# Patient Record
Sex: Female | Born: 1995 | Race: White | Hispanic: No | Marital: Single | Smoking: Never smoker
Health system: Southern US, Community
[De-identification: ages and names within clinical notes are randomized; demographics above are authoritative.]

## PROBLEM LIST (undated history)

## (undated) DIAGNOSIS — R519 Headache, unspecified: Secondary | ICD-10-CM

## (undated) DIAGNOSIS — R51 Headache: Secondary | ICD-10-CM

## (undated) DIAGNOSIS — J45909 Unspecified asthma, uncomplicated: Secondary | ICD-10-CM

---
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
## 2018-07-06 ENCOUNTER — Ambulatory Visit (INDEPENDENT_AMBULATORY_CARE_PROVIDER_SITE_OTHER): Payer: No Typology Code available for payment source | Admitting: Family Medicine

## 2018-07-06 ENCOUNTER — Encounter: Payer: Self-pay | Admitting: Family Medicine

## 2018-07-06 VITALS — BP 120/78 | HR 90 | Temp 98.4°F | Ht 61.75 in | Wt 141.0 lb

## 2018-07-06 DIAGNOSIS — G8929 Other chronic pain: Secondary | ICD-10-CM

## 2018-07-06 DIAGNOSIS — M25561 Pain in right knee: Secondary | ICD-10-CM | POA: Diagnosis not present

## 2018-07-06 DIAGNOSIS — G43109 Migraine with aura, not intractable, without status migrainosus: Secondary | ICD-10-CM | POA: Diagnosis not present

## 2018-07-06 DIAGNOSIS — F419 Anxiety disorder, unspecified: Secondary | ICD-10-CM

## 2018-07-06 DIAGNOSIS — G43909 Migraine, unspecified, not intractable, without status migrainosus: Secondary | ICD-10-CM | POA: Insufficient documentation

## 2018-07-06 DIAGNOSIS — G4700 Insomnia, unspecified: Secondary | ICD-10-CM | POA: Diagnosis not present

## 2018-07-06 DIAGNOSIS — Z23 Encounter for immunization: Secondary | ICD-10-CM

## 2018-07-06 MED ORDER — SUMATRIPTAN SUCCINATE 100 MG PO TABS: 100.0000 mg | ORAL_TABLET | Freq: Every day | ORAL | 1 refills | Status: DC | PRN

## 2018-07-06 NOTE — Progress Notes (Signed)
Rachel [name] DOB: Sep 06, 1996 Encounter date: 07/06/2018  This isa 22 y.o. female who presents to establish care. Chief Complaint  Patient presents with  . New Patient (Initial Visit)  . Knee Pain    X 2 years, more painful the more she uses it, pt staes " there is a knott on the side of my knee that sticks out sometimes"     History of present illness: [place] so adjusting to move. 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   Has a very hard time falling asleep. Has always had this. Hard to turn off mind. Wakes frequently through night. Always wakes up tired. When in 6th grade treated with topiramate for chronic migraines. Used maxalt for this in past. Wakes with headaches most days. Initially helped but then stopped responding to medication. Birth control helped with headaches quite a lot. Sometimes she gets headaches that last for a few weeks straight. Was referred to neuro but never got to go. Mom had brain tumor so she does worry about this. With migraines does throw up. Light and sound sensitivity. Gets them a few times/month. Stress related; worries about "everything all the time". Likes to lay in dark when she gets these. Has a lot of anxiety attacks. Works with her five senses and boyfriend helps her breathe through panic attacks which is helpful. Can last for a week. Feels like she wakes with headaches and then small things can set off worsening migraine. Will try advil (usually 468m); but usually not more than a couple of times. Excedrin migraine does help. Birth control helped most of every medication. (also helped with her heavy menses and cramping).  Getting new vitamin L-tyrosin, melatonin, maca to help with migraine.   There was some concern with her being ADHD in the past. She does feel like she has trouble focusing, esp when reading. Doing online schooling right now. Testing did not reveal ADHD, but she still has some symptoms. Does feel like it affects her grades  and ends up re-reading everything multiple times to understand it.   Has had eye check recently. Working with eye doc to get rx correct.   In middle school had left meniscus torn and went 3 years later to doc and found torn meniscus and had surgery. That knee has done well since surgery. Right knee injury shortly after. Doing bleacher jumps and had intense pain, told her there was torn cartilage and no surgery needed. Then would flare with use or intense activity. Then 2 years ago noted that there is bump that comes up lateral right knee that flares when she gets knee pain. Just hurts when using knee. More active she is the more it bothers her and flares. Monday walked for a couple of miles and then bump enlarged and knee bothered her more.    No past medical history on file. Past Surgical History:  Procedure Laterality Date  . KNEE ARTHROSCOPY Left 2013   meniscus repair   Allergies  Allergen Reactions  . Sulfur Hives   Current Meds  Medication Sig  . norethindrone-ethinyl estradiol 1/35 (NORTREL 1/35, 21,) tablet Take 1 tablet by mouth daily.   Social History   Tobacco Use  . Smoking status: Never Smoker  . Smokeless tobacco: Never Used  Substance Use Topics  . Alcohol use: Yes    Comment: occasional   Family History  Problem Relation Age of Onset  . Bipolar disorder Mother   . Schizophrenia Mother  significant paranoia  . OCD Mother   . Migraines Mother   . Atrial fibrillation Father   . High Cholesterol Father   . Migraines Sister   . CAD Maternal Grandmother   . Vascular Disease Maternal Grandmother   . Mental illness Paternal Grandmother   . Migraines Sister   . Migraines Sister   . High Cholesterol Paternal Grandfather   . Heart disease Paternal Grandfather   . Migraines Paternal Grandfather      Review of Systems  Respiratory: Negative for cough, chest tightness and shortness of breath.   Cardiovascular: Negative for chest pain and leg swelling.   Genitourinary: Negative for menstrual problem (does well with ocp).  Musculoskeletal: Positive for gait problem (when R knee bothering her) and joint swelling.       Will get shaking right leg, esp if anxious.  Neurological: Positive for headaches. Negative for dizziness and light-headedness.  Psychiatric/Behavioral: Positive for sleep disturbance. The patient is nervous/anxious.     Objective:  BP 120/78 (BP Location: Left Arm, Patient Position: Sitting, Cuff Size: Normal)   Pulse 90   Temp 98.4 F (36.9 C) (Oral)   Ht 5' 1.75" (1.568 m)   Wt 141 lb (64 kg)   SpO2 99%   BMI 26.00 kg/m   Weight: 141 lb (64 kg)   BP Readings from Last 3 Encounters:  07/06/18 120/78   Wt Readings from Last 3 Encounters:  07/06/18 141 lb (64 kg)    Physical Exam  Constitutional: She is oriented to person, place, and time. She appears well-developed and well-nourished. No distress.  Eyes: Pupils are equal, round, and reactive to light.  Neck: Normal range of motion. Neck supple. No thyromegaly present.  Cardiovascular: Normal rate, regular rhythm and normal heart sounds. Exam reveals no friction rub.  No murmur heard. No lower extremity edema  Pulmonary/Chest: Effort normal and breath sounds normal. No respiratory distress. She has no wheezes. She has no rales.  Abdominal: Soft. Bowel sounds are normal. She exhibits no distension and no mass. There is no tenderness. There is no guarding.  Musculoskeletal:  Full ROM of right knee. There is some pain with lateral meniscal pressure and a slight lateral JLT. There is no obvious effusion. There is some patellar hypermobility. No patellar tenderness.   Lymphadenopathy:    She has no cervical adenopathy.  Neurological: She is alert and oriented to person, place, and time.  Psychiatric: She has a normal mood and affect. Her speech is normal and behavior is normal. Thought content normal. Cognition and memory are normal.  ADHD adult questionnaire  scanned. + for significant difficulty with concentration.      Assessment/Plan: 1. Insomnia, unspecified type Good sleep hygiene. Consider sleep study in future pending lab review/physical.  2. Migraine with aura and without status migrainosus, not intractable Chronic; trial imitrex for headache abortant. Low tyramine diet information given today. Recommend headache diary for next month. Will order follow up labwork/studies pending record review. Let me know sooner if worsening or change in sx. We discussed consideration for propranolol as well since this may also help with anxiety.  Also suggested B2 474m daily to help with prophylaxis. Exercise is important as well; can consider lower impact like tai chi to help with de-stress.   - SUMAtriptan (IMITREX) 100 MG tablet; Take 1 tablet (100 mg total) by mouth daily as needed for migraine. May repeat in 2 hours if headache persists or recurs not to exceed 2 tablets in 24 hours.  Dispense: 10 tablet; Refill: 1  3. Chronic pain of right knee Due to longstanding and recurring issue; will send to ortho for further eval and treatment plan. - Ambulatory referral to Orthopedic Surgery  4. Need for hepatitis B booster vaccination Completed in office today. - Hepatitis B vaccine adult IM  Return pending lab review.  Micheline Rough, MD

## 2018-07-06 NOTE — Patient Instructions (Addendum)
Consider Riboflavin B2 for migraine prophylaxis 400mg  daily.   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 I will likely order additional bloodwork and then schedule a follow up to re-discuss migraines/sleep issues.     Insomnia Insomnia is a sleep disorder that makes it difficult to fall asleep or to stay asleep. Insomnia can cause tiredness (fatigue), low energy, difficulty concentrating, mood swings, and poor performance at work or school. There are three different ways to classify insomnia:  Difficulty falling asleep.  Difficulty staying asleep.  Waking up too early in the morning.  Any type of insomnia can be long-term (chronic) or short-term (acute). Both are common. Short-term insomnia usually lasts for three months or less. Chronic insomnia occurs at least three times a week for longer than three months. What are the causes? Insomnia may be caused by another condition, situation, or substance, such as:  Anxiety.  Certain medicines.  Gastroesophageal reflux disease (GERD) or other gastrointestinal conditions.  Asthma or other breathing conditions.  Restless legs syndrome, sleep apnea, or other sleep disorders.  Chronic pain.  Menopause. This may include hot flashes.  Stroke.  Abuse of alcohol, tobacco, or illegal drugs.  Depression.  Caffeine.  Neurological disorders, such as Alzheimer disease.  An overactive thyroid (hyperthyroidism).  The cause of insomnia may not be known. What increases the risk? Risk factors for insomnia include:  Gender. Women are more commonly affected than men.  Age. Insomnia is more common as you get older.  Stress. This may involve your professional or personal life.  Income. Insomnia is more common in people with lower income.  Lack of exercise.  Irregular work schedule or night shifts.  Traveling between different time zones.  What are the signs or symptoms? If you have insomnia, trouble falling asleep  or trouble staying asleep is the main symptom. This may lead to other symptoms, such as:  Feeling fatigued.  Feeling nervous about going to sleep.  Not feeling rested in the morning.  Having trouble concentrating.  Feeling irritable, anxious, or depressed.  How is this treated? Treatment for insomnia depends on the cause. If your insomnia is caused by an underlying condition, treatment will focus on addressing the condition. Treatment may also include:  Medicines to help you sleep.  Counseling or therapy.  Lifestyle adjustments.  Follow these instructions at home:  Take medicines only as directed by your health care provider.  Keep regular sleeping and waking hours. Avoid naps.  Keep a sleep diary to help you and your health care provider figure out what could be causing your insomnia. Include: ? When you sleep. ? When you wake up during the night. ? How well you sleep. ? How rested you feel the next day. ? Any side effects of medicines you are taking. ? What you eat and drink.  Make your bedroom a comfortable place where it is easy to fall asleep: ? Put up shades or special blackout curtains to block light from outside. ? Use a white noise machine to block noise. ? Keep the temperature cool.  Exercise regularly as directed by your health care provider. Avoid exercising right before bedtime.  Use relaxation techniques to manage stress. Ask your health care provider to suggest some techniques that may work well for you. These may include: ? Breathing exercises. ? Routines to release muscle tension. ? Visualizing peaceful scenes.  Cut back on alcohol, caffeinated beverages, and cigarettes, especially close to bedtime. These can disrupt your sleep.  Do not  overeat or eat spicy foods right before bedtime. This can lead to digestive discomfort that can make it hard for you to sleep.  Limit screen use before bedtime. This includes: ? Watching TV. ? Using your  smartphone, tablet, and computer.  Stick to a routine. This can help you fall asleep faster. Try to do a quiet activity, brush your teeth, and go to bed at the same time each night.  Get out of bed if you are still awake after 15 minutes of trying to sleep. Keep the lights down, but try reading or doing a quiet activity. When you feel sleepy, go back to bed.  Make sure that you drive carefully. Avoid driving if you feel very sleepy.  Keep all follow-up appointments as directed by your health care provider. This is important. Contact a health care provider if:  You are tired throughout the day or have trouble in your daily routine due to sleepiness.  You continue to have sleep problems or your sleep problems get worse. Get help right away if:  You have serious thoughts about hurting yourself or someone else. This information is not intended to replace advice given to you by your health care provider. Make sure you discuss any questions you have with your health care provider. Document Released: 10/09/2000 Document Revised: 03/13/2016 Document Reviewed: 07/13/2014 Elsevier Interactive Patient Education  [employer].

## 2018-07-08 ENCOUNTER — Encounter: Payer: Self-pay | Admitting: Family Medicine

## 2018-07-11 ENCOUNTER — Telehealth: Payer: Self-pay

## 2018-07-11 NOTE — Telephone Encounter (Signed)
Noted will send to Dr. Hassan RowanKoberlein as Lorain ChildesFYI

## 2018-07-11 NOTE — Telephone Encounter (Signed)
Pt states that she has not had those labs done.

## 2018-07-11 NOTE — Telephone Encounter (Signed)
Called patient per request of Dr. Hassan RowanKoberlein to ask if labs such as blood glucose, lipid panel ect. Have been done. Patient has been asked to call back.

## 2018-07-13 ENCOUNTER — Other Ambulatory Visit: Payer: Self-pay | Admitting: Family Medicine

## 2018-07-13 DIAGNOSIS — Z13220 Encounter for screening for lipoid disorders: Secondary | ICD-10-CM

## 2018-07-13 DIAGNOSIS — G43809 Other migraine, not intractable, without status migrainosus: Secondary | ICD-10-CM

## 2018-07-13 DIAGNOSIS — Z131 Encounter for screening for diabetes mellitus: Secondary | ICD-10-CM

## 2018-07-13 MED FILL — SUMAtriptan SUCCINATE 100 M: 100 | 30 days supply | Qty: 9 | Fill #0

## 2018-07-13 NOTE — Telephone Encounter (Signed)
Called and spoke with patient she stated that she would call back to make a lab appointment once she knows her work schedule. Patient also agreed to follow up a few months after lab results come back.   Patient asked about referral that was placed 07/06/18, Patient was informed that their office had contacted her about scheduling her appointment but they said the number was disconnected and they confirmed her number with us. Patient has been given contact info for their office and advised to call back If she is unable to get an appointment since the referral has been closed.

## 2018-07-13 NOTE — Telephone Encounter (Signed)
I have ordered bloodwork for her to complete (fasting) when convenient. We can then plan to review at a follow up appointment where we also re-visit her migraines. I would recommend pushing this appointment out at least a couple of months so we can get a good symptoms diary to review.

## 2018-07-14 ENCOUNTER — Other Ambulatory Visit (INDEPENDENT_AMBULATORY_CARE_PROVIDER_SITE_OTHER): Payer: No Typology Code available for payment source

## 2018-07-14 DIAGNOSIS — Z13220 Encounter for screening for lipoid disorders: Secondary | ICD-10-CM

## 2018-07-14 DIAGNOSIS — G43809 Other migraine, not intractable, without status migrainosus: Secondary | ICD-10-CM | POA: Diagnosis not present

## 2018-07-14 DIAGNOSIS — Z131 Encounter for screening for diabetes mellitus: Secondary | ICD-10-CM | POA: Diagnosis not present

## 2018-07-14 LAB — CBC WITH DIFFERENTIAL/PLATELET
BASOS PCT: 1.7 % (ref 0.0–3.0)
Basophils Absolute: 0.1 10*3/uL (ref 0.0–0.1)
EOS ABS: 0.1 10*3/uL (ref 0.0–0.7)
Eosinophils Relative: 2.8 % (ref 0.0–5.0)
HCT: 39.9 % (ref 36.0–46.0)
HEMOGLOBIN: 13.5 g/dL (ref 12.0–15.0)
Lymphocytes Relative: 49.6 % — ABNORMAL HIGH (ref 12.0–46.0)
Lymphs Abs: 1.9 10*3/uL (ref 0.7–4.0)
MCHC: 33.8 g/dL (ref 30.0–36.0)
MCV: 87.1 fl (ref 78.0–100.0)
MONO ABS: 0.3 10*3/uL (ref 0.1–1.0)
Monocytes Relative: 6.6 % (ref 3.0–12.0)
Neutro Abs: 1.5 10*3/uL (ref 1.4–7.7)
Neutrophils Relative %: 39.3 % — ABNORMAL LOW (ref 43.0–77.0)
Platelets: 186 10*3/uL (ref 150.0–400.0)
RBC: 4.58 Mil/uL (ref 3.87–5.11)
RDW: 12.7 % (ref 11.5–15.5)
WBC: 3.8 10*3/uL — AB (ref 4.0–10.5)

## 2018-07-14 LAB — LIPID PANEL
CHOLESTEROL: 127 mg/dL (ref 0–200)
HDL: 89.6 mg/dL (ref >39.00)
LDL CALC: 31 mg/dL (ref 0–99)
NONHDL: 37.63
TRIGLYCERIDES: 32 mg/dL (ref 0.0–149.0)
Total CHOL/HDL Ratio: 1
VLDL: 6.4 mg/dL (ref 0.0–40.0)

## 2018-07-14 LAB — COMPREHENSIVE METABOLIC PANEL
ALT: 13 U/L (ref 0–35)
AST: 14 U/L (ref 0–37)
Albumin: 4.1 g/dL (ref 3.5–5.2)
Alkaline Phosphatase: 33 U/L — ABNORMAL LOW (ref 39–117)
BUN: 13 mg/dL (ref 6–23)
CALCIUM: 9.3 mg/dL (ref 8.4–10.5)
CO2: 29 meq/L (ref 19–32)
CREATININE: 0.63 mg/dL (ref 0.40–1.20)
Chloride: 104 mEq/L (ref 96–112)
GFR: 125.74 mL/min (ref >60.00)
Glucose, Bld: 78 mg/dL (ref 70–99)
Potassium: 4.2 mEq/L (ref 3.5–5.1)
SODIUM: 140 meq/L (ref 135–145)
Total Bilirubin: 0.5 mg/dL (ref 0.2–1.2)
Total Protein: 6.6 g/dL (ref 6.0–8.3)

## 2018-07-14 LAB — TSH: TSH: 1.51 u[IU]/mL (ref 0.35–4.50)

## 2018-08-03 ENCOUNTER — Ambulatory Visit (INDEPENDENT_AMBULATORY_CARE_PROVIDER_SITE_OTHER): Payer: No Typology Code available for payment source | Admitting: Orthopaedic Surgery

## 2018-08-03 ENCOUNTER — Encounter (INDEPENDENT_AMBULATORY_CARE_PROVIDER_SITE_OTHER): Payer: Self-pay | Admitting: Orthopaedic Surgery

## 2018-08-03 ENCOUNTER — Ambulatory Visit (INDEPENDENT_AMBULATORY_CARE_PROVIDER_SITE_OTHER): Payer: No Typology Code available for payment source

## 2018-08-03 VITALS — BP 117/68 | HR 63 | Ht 62.5 in | Wt 139.2 lb

## 2018-08-03 DIAGNOSIS — G8929 Other chronic pain: Secondary | ICD-10-CM | POA: Diagnosis not present

## 2018-08-03 DIAGNOSIS — M25561 Pain in right knee: Secondary | ICD-10-CM

## 2018-08-03 NOTE — Progress Notes (Signed)
   Office Visit Note   Patient: Rachel [name]           Date of Birth: 1996-10-25           MRN: [id number] Visit Date: 08/03/2018              Requested by: Wynn Banker, MD [address] [place], [place] [address] PCP: Wynn Banker, MD   Assessment & Plan: Visit Diagnoses:  1. Chronic pain of right knee     Plan: might have tear of MM-will order MRI  Follow-Up Instructions: Return after MRI right knee.   Orders:  Orders Placed This Encounter  Procedures  . XR KNEE 3 VIEW RIGHT   No orders of the defined types were placed in this encounter.     Procedures: No procedures performed   Clinical Data: No additional findings.   Subjective: No chief complaint on file. 22 yr female with at least 7 yr hx right knee pain with presumptive dx of "torn Meniscus" per prior eval.Had prior left knee arhtroscopy for similar problem at age 74. Right knee with occ swelling and difficulty with knee bends-catching, pain med and lat joint, No inj or trauma  HPI  Review of Systems   Objective: Vital Signs: There were no vitals taken for this visit.  Physical Exam  Ortho Examright knee with med more than lat jt pain, No instability, No effusion. N/v intact  Specialty Comments:  No specialty comments available.  Imaging: Xr Knee 3 View Right  Result Date: 08/03/2018 Films neg for any acute change or OA. Joint spaces well maintained    PMFS History: Patient Active Problem List   Diagnosis Date Noted  . Migraine 07/06/2018  . Anxiety 07/06/2018   History reviewed. No pertinent past medical history.  Family History  Problem Relation Age of Onset  . Bipolar disorder Mother   . Schizophrenia Mother        significant paranoia  . OCD Mother   . Migraines Mother   . Atrial fibrillation Father   . High Cholesterol Father   . Migraines Sister   . CAD Maternal Grandmother   . Vascular Disease Maternal Grandmother   . Mental illness Paternal  Grandmother   . Migraines Sister   . Migraines Sister   . High Cholesterol Paternal Grandfather   . Heart disease Paternal Grandfather   . Migraines Paternal Grandfather     Past Surgical History:  Procedure Laterality Date  . KNEE ARTHROSCOPY Left 2013   meniscus repair   Social History   Occupational History  . Not on file  Tobacco Use  . Smoking status: Never Smoker  . Smokeless tobacco: Never Used  Substance and Sexual Activity  . Alcohol use: Yes    Comment: occasional  . Drug use: Never  . Sexual activity: Yes    Partners: Male     [name], MD   Note - This record has been created using [employer].  Chart creation errors have been sought, but may not always  have been located. Such creation errors do not reflect on  the standard of medical care.

## 2018-08-05 ENCOUNTER — Other Ambulatory Visit (INDEPENDENT_AMBULATORY_CARE_PROVIDER_SITE_OTHER): Payer: Self-pay | Admitting: Orthopaedic Surgery

## 2018-08-05 DIAGNOSIS — M25561 Pain in right knee: Principal | ICD-10-CM

## 2018-08-05 DIAGNOSIS — G8929 Other chronic pain: Secondary | ICD-10-CM

## 2018-08-10 ENCOUNTER — Ambulatory Visit (HOSPITAL_COMMUNITY)
Admission: RE | Admit: 2018-08-10 | Discharge: 2018-08-10 | Disposition: A | Discharge status: Home / Self Care | Payer: No Typology Code available for payment source | Source: Ambulatory Visit | Attending: Orthopaedic Surgery | Admitting: Orthopaedic Surgery

## 2018-08-10 DIAGNOSIS — G8929 Other chronic pain: Secondary | ICD-10-CM | POA: Diagnosis not present

## 2018-08-10 DIAGNOSIS — M25561 Pain in right knee: Secondary | ICD-10-CM | POA: Insufficient documentation

## 2018-08-10 DIAGNOSIS — S83281A Other tear of lateral meniscus, current injury, right knee, initial encounter: Secondary | ICD-10-CM | POA: Insufficient documentation

## 2018-08-10 DIAGNOSIS — X58XXXA Exposure to other specified factors, initial encounter: Secondary | ICD-10-CM | POA: Diagnosis not present

## 2018-08-17 ENCOUNTER — Ambulatory Visit: Payer: No Typology Code available for payment source | Admitting: Family Medicine

## 2018-08-17 ENCOUNTER — Encounter (INDEPENDENT_AMBULATORY_CARE_PROVIDER_SITE_OTHER): Payer: Self-pay | Admitting: Orthopedic Surgery

## 2018-08-17 ENCOUNTER — Ambulatory Visit (INDEPENDENT_AMBULATORY_CARE_PROVIDER_SITE_OTHER): Payer: No Typology Code available for payment source | Admitting: Orthopedic Surgery

## 2018-08-17 VITALS — BP 94/65 | HR 54 | Ht 61.0 in | Wt 139.0 lb

## 2018-08-17 DIAGNOSIS — S83261D Peripheral tear of lateral meniscus, current injury, right knee, subsequent encounter: Secondary | ICD-10-CM

## 2018-08-17 DIAGNOSIS — M23006 Cystic meniscus, unspecified meniscus, right knee: Secondary | ICD-10-CM

## 2018-08-17 NOTE — Progress Notes (Signed)
Office Visit Note   Patient: Rachel [name]           Date of Birth: 03/28/1996           MRN: [id number] Visit Date: 08/17/2018              Requested by: Wynn Banker, MD [address] [place], [place] [address] PCP: Wynn Banker, MD   Assessment & Plan: Visit Diagnoses:  1. Peripheral tear of lateral meniscus of right knee as current injury, subsequent encounter   2. Meniscal cyst, right     Plan:  #1: Arthroscopic debridement lateral meniscal tear with removal of the para meniscal cyst right knee.  Procedure risks and benefits were explained to her in detail and she is understanding would like to proceed  Follow-Up Instructions: No follow-ups on file.   Face-to-face time spent with patient was greater than 25 minutes.  Greater than 50% of the time was spent in counseling and coordination of care.   Orders:  No orders of the defined types were placed in this encounter.  No orders of the defined types were placed in this encounter.     Procedures: No procedures performed   Clinical Data: No additional findings.   Subjective: Chief Complaint  Patient presents with  . Follow-up    10817/19 mri review r knee    HPI  Rachel [name] is a very pleasant 22 year old who returns today for follow-up of her MRI scan right knee. SHe has had pain for many years in the right knee.  She had a very similar problem in her left knee and underwent an arthroscopic Nischal tear her right knee though has occasional swelling flexion pain more lateral than medial.  She has not had any injections at this time.  At her last visit was ordered today she is here for review of the MRI scan.  Review of Systems  Constitutional: Negative for fatigue and fever.  HENT: Negative for ear pain.   Eyes: Negative for pain.  Respiratory: Negative for cough and shortness of breath.   Cardiovascular: Negative for leg swelling.  Gastrointestinal: Negative for constipation and diarrhea.    Genitourinary: Negative for difficulty urinating.  Musculoskeletal: Negative for back pain and neck pain.  Skin: Negative for rash.  Allergic/Immunologic: Negative for food allergies.  Neurological: Positive for weakness. Negative for numbness.  Hematological: Does not bruise/bleed easily.  Psychiatric/Behavioral: Negative for sleep disturbance.     Objective: Vital Signs: BP 94/65 (BP Location: Left Arm, Patient Position: Sitting, Cuff Size: Normal)   Pulse (!) 54   Ht 5\' 1"  (1.549 m)   Wt 139 lb (63 kg)   BMI 26.26 kg/m   Physical Exam  Constitutional: She is oriented to person, place, and time. She appears well-developed and well-nourished.  HENT:  Mouth/Throat: Oropharynx is clear and moist.  Eyes: Pupils are equal, round, and reactive to light. EOM are normal.  Pulmonary/Chest: Effort normal.  Neurological: She is alert and oriented to person, place, and time.  Skin: Skin is warm and dry.  Psychiatric: She has a normal mood and affect. Her behavior is normal.    Ortho Exam  Today she has range of motion from 0 to 110 degrees.  Swelling over the lateral meniscus.  More pain to palpation over the medial greater than lateral.  Specialty Comments:  No specialty comments available.  Imaging: MRI scan reveals a small vertical tear along the superior surface of the anterior horn- body junction of the  lateral meniscus with a 7.7 x 2.5 x 5 mm multi loculated cystic structure along the periphery of the lateral meniscus most consistent with para meniscal cyst  PMFS History: Current Outpatient Medications  Medication Sig Dispense Refill  . norethindrone-ethinyl estradiol 1/35 (NORTREL 1/35, 21,) tablet Take 1 tablet by mouth daily.    . SUMAtriptan (IMITREX) 100 MG tablet Take 1 tablet (100 mg total) by mouth daily as needed for migraine. May repeat in 2 hours if headache persists or recurs not to exceed 2 tablets in 24 hours. 10 tablet 1   No current facility-administered  medications for this visit.     Patient Active Problem List   Diagnosis Date Noted  . Migraine 07/06/2018  . Anxiety 07/06/2018   History reviewed. No pertinent past medical history.  Family History  Problem Relation Age of Onset  . Bipolar disorder Mother   . Schizophrenia Mother        significant paranoia  . OCD Mother   . Migraines Mother   . Atrial fibrillation Father   . High Cholesterol Father   . Migraines Sister   . CAD Maternal Grandmother   . Vascular Disease Maternal Grandmother   . Mental illness Paternal Grandmother   . Migraines Sister   . Migraines Sister   . High Cholesterol Paternal Grandfather   . Heart disease Paternal Grandfather   . Migraines Paternal Grandfather     Past Surgical History:  Procedure Laterality Date  . KNEE ARTHROSCOPY Left 2013   meniscus repair   Social History   Occupational History  . Not on file  Tobacco Use  . Smoking status: Never Smoker  . Smokeless tobacco: Never Used  Substance and Sexual Activity  . Alcohol use: Yes    Comment: occasional  . Drug use: Never  . Sexual activity: Yes    Partners: Male

## 2018-08-24 ENCOUNTER — Other Ambulatory Visit (HOSPITAL_COMMUNITY)
Admission: RE | Admit: 2018-08-24 | Discharge: 2018-08-24 | Disposition: A | Discharge status: Home / Self Care | Payer: No Typology Code available for payment source | Source: Ambulatory Visit | Attending: Family Medicine | Admitting: Family Medicine

## 2018-08-24 ENCOUNTER — Encounter: Payer: Self-pay | Admitting: Family Medicine

## 2018-08-24 ENCOUNTER — Ambulatory Visit (INDEPENDENT_AMBULATORY_CARE_PROVIDER_SITE_OTHER): Payer: No Typology Code available for payment source | Admitting: Family Medicine

## 2018-08-24 VITALS — BP 120/62 | HR 77 | Temp 98.3°F | Wt 141.1 lb

## 2018-08-24 DIAGNOSIS — G43109 Migraine with aura, not intractable, without status migrainosus: Secondary | ICD-10-CM | POA: Diagnosis not present

## 2018-08-24 DIAGNOSIS — Z0000 Encounter for general adult medical examination without abnormal findings: Secondary | ICD-10-CM | POA: Insufficient documentation

## 2018-08-24 DIAGNOSIS — D72819 Decreased white blood cell count, unspecified: Secondary | ICD-10-CM | POA: Diagnosis not present

## 2018-08-24 DIAGNOSIS — G479 Sleep disorder, unspecified: Secondary | ICD-10-CM

## 2018-08-24 LAB — CBC WITH DIFFERENTIAL/PLATELET
BASOS ABS: 0 10*3/uL (ref 0.0–0.1)
Basophils Relative: 1.1 % (ref 0.0–3.0)
EOS PCT: 1.8 % (ref 0.0–5.0)
Eosinophils Absolute: 0.1 10*3/uL (ref 0.0–0.7)
HCT: 40.8 % (ref 36.0–46.0)
Hemoglobin: 13.9 g/dL (ref 12.0–15.0)
LYMPHS PCT: 41.1 % (ref 12.0–46.0)
Lymphs Abs: 1.6 10*3/uL (ref 0.7–4.0)
MCHC: 34.1 g/dL (ref 30.0–36.0)
MCV: 87.2 fl (ref 78.0–100.0)
MONOS PCT: 5.7 % (ref 3.0–12.0)
Monocytes Absolute: 0.2 10*3/uL (ref 0.1–1.0)
NEUTROS PCT: 50.3 % (ref 43.0–77.0)
Neutro Abs: 1.9 10*3/uL (ref 1.4–7.7)
Platelets: 204 10*3/uL (ref 150.0–400.0)
RBC: 4.68 Mil/uL (ref 3.87–5.11)
RDW: 12.6 % (ref 11.5–15.5)
WBC: 3.8 10*3/uL — AB (ref 4.0–10.5)

## 2018-08-24 MED ORDER — PROPRANOLOL HCL 40 MG PO TABS: 40.0000 mg | ORAL_TABLET | Freq: Two times a day (BID) | ORAL | 2 refills | Status: DC

## 2018-08-24 MED FILL — PROPRANOLOL 40 MG TABLET: 40 | 30 days supply | Qty: 60 | Fill #0

## 2018-08-24 MED FILL — SUMAtriptan SUCCINATE 100 M: 100 | 30 days supply | Qty: 9 | Fill #1

## 2018-08-24 NOTE — Progress Notes (Signed)
Rachel [name] DOB: 09-09-1996 Encounter date: 08/24/2018  This is a 22 y.o. female who presents for complete physical   History of present illness/Additional concerns:  Ortho planning to scope right knee due to lateral meniscus tear/ongoing pain. She is looking out towards January.   Migraine update (trial imitrex?). Did try the sumatriptan with bad migraine. Gets some relief in a few hours. Right now going on 14 days of headache and hasn't been able to stop this. Does work for those acute migraine situation. Feels like headaches have gotten worse since last visit in amount. Taking riboflavin daily. Chocolate seemed to trigger headache. Was on topamax in past and maxed out with this and didn't get relief. Has been getting migraines since age 59. Usually wakes with headache. Seems somewhat positional (headache seems to go to dependent side). More sleep seems to cause headache.   Has been taking melatonin which helps, but gets disrupted easily still. Working on getting to sleep and staying asleep. If sleep disrupted then will get headache. Not snoring. Feels like she is not usually in full sleep; too aware of surroundings/noises around her. If very tired might snore.   No past medical history on file. Past Surgical History:  Procedure Laterality Date  . KNEE ARTHROSCOPY Left 2013   meniscus repair   Allergies  Allergen Reactions  . Sulfur Hives   Current Meds  Medication Sig  . norethindrone-ethinyl estradiol 1/35 (NORTREL 1/35, 21,) tablet Take 1 tablet by mouth daily.  . SUMAtriptan (IMITREX) 100 MG tablet Take 1 tablet (100 mg total) by mouth daily as needed for migraine. May repeat in 2 hours if headache persists or recurs not to exceed 2 tablets in 24 hours.   Social History   Tobacco Use  . Smoking status: Never Smoker  . Smokeless tobacco: Never Used  Substance Use Topics  . Alcohol use: Yes    Comment: occasional   Family History  Problem Relation Age of Onset  . Bipolar  disorder Mother   . Schizophrenia Mother        significant paranoia  . OCD Mother   . Migraines Mother   . Atrial fibrillation Father   . High Cholesterol Father   . Migraines Sister   . CAD Maternal Grandmother   . Vascular Disease Maternal Grandmother   . Mental illness Paternal Grandmother   . Migraines Sister   . Migraines Sister   . High Cholesterol Paternal Grandfather   . Heart disease Paternal Grandfather   . Migraines Paternal Grandfather      Review of Systems  Constitutional: Positive for fatigue (somewhat). Negative for activity change, appetite change, chills, fever and unexpected weight change.  HENT: Negative for congestion, ear pain, hearing loss, sinus pressure, sinus pain, sore throat and trouble swallowing.   Eyes: Negative for pain and visual disturbance.  Respiratory: Negative for cough, chest tightness, shortness of breath and wheezing.   Cardiovascular: Negative for chest pain, palpitations and leg swelling.  Gastrointestinal: Negative for abdominal pain, blood in stool, constipation, diarrhea, nausea and vomiting.  Genitourinary: Negative for difficulty urinating and menstrual problem.  Musculoskeletal: Positive for arthralgias (knee; see hpi). Negative for back pain.  Skin: Negative for rash.  Neurological: Positive for headaches (see hpi). Negative for dizziness, weakness and numbness.  Hematological: Negative for adenopathy. Does not bruise/bleed easily.  Psychiatric/Behavioral: Positive for sleep disturbance (chronic). Negative for suicidal ideas. The patient is not nervous/anxious.     CBC:  Lab Results  Component Value Date  WBC 3.8 (L) 08/24/2018   HGB 13.9 08/24/2018   HCT 40.8 08/24/2018   MCHC 34.1 08/24/2018   RDW 12.6 08/24/2018   PLT 204.0 08/24/2018   CMP: Lab Results  Component Value Date   NA 140 07/14/2018   K 4.2 07/14/2018   CL 104 07/14/2018   CO2 29 07/14/2018   GLUCOSE 78 07/14/2018   BUN 13 07/14/2018   CREATININE  0.63 07/14/2018   CALCIUM 9.3 07/14/2018   PROT 6.6 07/14/2018   BILITOT 0.5 07/14/2018   ALKPHOS 33 (L) 07/14/2018   ALT 13 07/14/2018   AST 14 07/14/2018   LIPID: Lab Results  Component Value Date   CHOL 127 07/14/2018   TRIG 32.0 07/14/2018   HDL 89.60 07/14/2018   LDLCALC 31 07/14/2018    Objective:  BP 120/62 (BP Location: Left Arm, Patient Position: Sitting, Cuff Size: Normal)   Pulse 77   Temp 98.3 F (36.8 C) (Oral)   Wt 141 lb 1.6 oz (64 kg)   SpO2 99%   BMI 26.66 kg/m   Weight: 141 lb 1.6 oz (64 kg)   BP Readings from Last 3 Encounters:  08/24/18 120/62  08/17/18 94/65  08/05/18 117/68   Wt Readings from Last 3 Encounters:  08/24/18 141 lb 1.6 oz (64 kg)  08/17/18 139 lb (63 kg)  08/05/18 139 lb 3.2 oz (63.1 kg)    Physical Exam  Constitutional: She is oriented to person, place, and time. She appears well-developed and well-nourished. No distress.  HENT:  Head: Normocephalic and atraumatic.  Right Ear: External ear normal.  Left Ear: External ear normal.  Mouth/Throat: Oropharynx is clear and moist. No oropharyngeal exudate.  Eyes: Pupils are equal, round, and reactive to light. Conjunctivae are normal.  Neck: Normal range of motion. Neck supple. No thyromegaly present.  Cardiovascular: Normal rate, regular rhythm and normal heart sounds. Exam reveals no gallop and no friction rub.  No murmur heard. Pulmonary/Chest: Effort normal and breath sounds normal. Right breast exhibits no inverted nipple, no mass, no nipple discharge and no skin change. Left breast exhibits no inverted nipple, no mass, no nipple discharge and no skin change.  Abdominal: Soft. Bowel sounds are normal. She exhibits no distension and no mass. There is no tenderness. There is no guarding. No hernia.  Genitourinary: Vagina normal. Pelvic exam was performed with patient supine. There is no rash, tenderness, lesion or injury on the right labia. There is no rash, tenderness, lesion or  injury on the left labia. Uterus is not deviated, not enlarged, not fixed and not tender. Cervix exhibits no motion tenderness, no discharge and no friability. Right adnexum displays no mass, no tenderness and no fullness. Left adnexum displays no mass, no tenderness and no fullness.  Genitourinary Comments: Cervical ectropion  Musculoskeletal: Normal range of motion. She exhibits no edema, tenderness or deformity.  Lymphadenopathy:    She has no cervical adenopathy.  Neurological: She is alert and oriented to person, place, and time. She has normal strength. She displays normal reflexes.  Reflex Scores:      Tricep reflexes are 2+ on the right side and 2+ on the left side.      Bicep reflexes are 2+ on the right side and 2+ on the left side.      Brachioradialis reflexes are 2+ on the right side and 2+ on the left side.      Patellar reflexes are 2+ on the right side and 2+ on the left side. Skin:  Skin is warm and dry. No rash noted.  Psychiatric: She has a normal mood and affect. Her speech is normal and behavior is normal. Thought content normal.    Assessment/Plan: There are no preventive care reminders to display for this patient. Health Maintenance reviewed - up to date; HIV ordered today for routine screening. Safe sexual practices with single partner.  1. Preventative health care Keep up with healthy eating.  She plans to get more involved with exercise program following knee surgery. - HIV Antibody (routine testing w rflx); Future - Cytology - PAP - HIV Antibody (routine testing w rflx)  2. Leukopenia, unspecified type We will recheck for stability - CBC with Differential/Platelet; Future - CBC with Differential/Platelet  3. Migraine with aura and without status migrainosus, not intractable Trial propranolol.  She had not responded in past to Topamax.  I think it is reasonable with the duration of her migraines to send to a specialist for further evaluation and treatment.  She  has not had any head imaging in the past.  Sleep disturbance may play a role in her headaches sleep study could certainly be considered.  In the meanwhile she will continue to track headaches.  She has had some triggers (chocolate). - propranolol (INDERAL) 40 MG tablet; Take 1 tablet (40 mg total) by mouth 2 (two) times daily.  Dispense: 60 tablet; Refill: 2 - AMB referral to headache clinic  4. Sleep disturbance See above. She has had improved sleep with melatonin. Will continue to monitor. No regular snoring. Continue with good sleep hygiene.   Return in about 3 months (around 11/24/2018) for Chronic condition visit.  Theodis Shove, MD

## 2018-08-25 LAB — HIV ANTIBODY (ROUTINE TESTING W REFLEX): HIV 1&2 Ab, 4th Generation: NONREACTIVE

## 2018-08-25 LAB — CYTOLOGY - PAP: DIAGNOSIS: NEGATIVE

## 2018-09-06 ENCOUNTER — Telehealth: Payer: No Typology Code available for payment source | Admitting: Family Medicine

## 2018-09-06 DIAGNOSIS — R21 Rash and other nonspecific skin eruption: Secondary | ICD-10-CM

## 2018-09-06 NOTE — Progress Notes (Signed)
Based on what you shared with me it looks like you have a condition that should be evaluated in a face to face office visit.  NOTE: If you entered your credit card information for this eVisit, you will not be charged. You may see a "hold" on your card for the $30 but that hold will drop off and you will not have a charge processed.  If you are having a true medical emergency please call 911.  If you need an urgent face to face visit, [facility] has four urgent care centers for your convenience.  If you need care fast and have a high deductible or no insurance consider:   [email or web address] to reserve your spot online an avoid wait times  [facility] [address], [address] [place], [place] [address] 8 am to 8 pm Monday-Friday 10 am to 4 pm Saturday-Sunday *Across the street from [employer]  [facility]  [address] [place], [address] 8 am to 5 pm Monday-Friday * In the [facility] the [facility]   The following sites will take your  insurance:  . [facility]  [phone] Get Driving Directions Find a Provider at this Location  [address] [place], [place] [address] . 10 am to 8 pm Monday-Friday . 12 pm to 8 pm Saturday-Sunday   . [facility]  [phone] Get Driving Directions Find a Provider at this Location  [address], [address] [place], [place] [address] . 8 am to 8 pm Monday-Friday . 9 am to 6 pm Saturday . 11 am to 6 pm Sunday   . [facility] [place]  [phone] Get Driving Directions  [address] [place], [place] [address] . 8 am to 8 pm Monday-Friday . 8 am to 4 pm Saturday-Sunday   Your e-visit answers were reviewed by a board certified advanced clinical practitioner to complete your personal care plan.  Thank you for using e-Visits. 

## 2018-09-28 ENCOUNTER — Encounter: Payer: Self-pay | Admitting: Family Medicine

## 2018-09-28 ENCOUNTER — Other Ambulatory Visit: Payer: Self-pay | Admitting: Family Medicine

## 2018-09-28 MED ORDER — NORETHINDRONE-ETH ESTRADIOL 1-35 MG-MCG PO TABS: 1.0000 | ORAL_TABLET | Freq: Every day | ORAL | 3 refills | Status: DC

## 2018-09-28 MED FILL — ALYACEN 1-35-28 TABLET: 1-35 | 84 days supply | Qty: 84 | Fill #0

## 2018-09-29 MED FILL — PROPRANOLOL 40 MG TABLET: 40 | 30 days supply | Qty: 60 | Fill #1

## 2018-10-11 ENCOUNTER — Other Ambulatory Visit: Payer: No Typology Code available for payment source

## 2018-10-20 ENCOUNTER — Telehealth (INDEPENDENT_AMBULATORY_CARE_PROVIDER_SITE_OTHER): Payer: Self-pay | Admitting: Orthopaedic Surgery

## 2018-10-20 NOTE — Telephone Encounter (Signed)
LMOM, no paperwork in office

## 2018-10-20 NOTE — Telephone Encounter (Signed)
Patient called checking if paperwork for [employer] disability for Dr. Cleophas DunkerWhitfield to fill out was faxed earlier this week.  Patient requested a return call.

## 2018-10-27 NOTE — H&P (Signed)
Rachel [name] is an 23 y.o. female.    Chief Complaint: Painful right knee  HPI: Rachel [name] is a very pleasant 23 year old who had an MRI scan of her right knee. SHe has had pain for many years in the right knee.  She has had a very similar problem in her left knee and underwent an arthroscopic meniscectomy of her left knee.   Recent MRI scan of the right knee revealed a small vertical tear along the superior surface of the anterior horn-body junction of the lateral meniscus. 7.7 x 2.5 x 5 mm multiloculated cystic structure along the periphery of the lateral meniscus most consistent with a parameniscal cyst.she continues to have persistent pain and discomfort in the knee.      No past medical history on file.  Past Surgical History:  Procedure Laterality Date  . KNEE ARTHROSCOPY Left 2013   meniscus repair    Family History  Problem Relation Age of Onset  . Bipolar disorder Mother   . Schizophrenia Mother        significant paranoia  . OCD Mother   . Migraines Mother   . Atrial fibrillation Father   . High Cholesterol Father   . Migraines Sister   . CAD Maternal Grandmother   . Vascular Disease Maternal Grandmother   . Mental illness Paternal Grandmother   . Migraines Sister   . Migraines Sister   . High Cholesterol Paternal Grandfather   . Heart disease Paternal Grandfather   . Migraines Paternal Grandfather    Social History:  reports that she has never smoked. She has never used smokeless tobacco. She reports current alcohol use. She reports that she does not use drugs.  Allergies:  Allergies  Allergen Reactions  . Sulfur Hives    No medications prior to admission.    Review of Systems  Constitutional: Negative for fatigue and fever.  HENT: Negative for ear pain.   Eyes: Negative for pain.  Respiratory: Negative for cough and shortness of breath.   Cardiovascular: Negative for leg swelling.  Gastrointestinal: Negative for constipation and diarrhea.  Genitourinary:  Negative for difficulty urinating.  Musculoskeletal: Negative for back pain and neck pain.  Skin: Negative for rash.  Allergic/Immunologic: Negative for food allergies.  Neurological: Positive for weakness. Negative for numbness.  Hematological: Does not bruise/bleed easily.  Psychiatric/Behavioral: Negative for sleep disturbance.      Physical Exam  Constitutional: She is oriented to person, place, and time. She appears well-developed and well-nourished.  HENT:  Head: Normocephalic and atraumatic.  Eyes: Pupils are equal, round, and reactive to light. Conjunctivae and EOM are normal.  Neck: Neck supple.  Cardiovascular: Normal rate, regular rhythm, normal heart sounds and intact distal pulses.  Respiratory: Effort normal and breath sounds normal.  GI: Bowel sounds are normal.  Neurological: She is alert and oriented to person, place, and time.  Skin: Skin is warm and dry.  Psychiatric: She has a normal mood and affect. Her behavior is normal. Judgment and thought content normal.  Musculoskeletal: 0-110 degrees. Some swelling over lateral meniscus. Pain Medially.  <<<<<<<<<<<<<<<<<<<<<<<<<<<<<<<<<<<<<<<<<<<<<<<<<<<<<<<<<<<<<<<<<<<<<<<<<<<<< MRI OF THE RIGHT KNEE WITHOUT CONTRAST  TECHNIQUE: Multiplanar, multisequence MR imaging of the knee was performed. No intravenous contrast was administered.  COMPARISON:  None.  FINDINGS: MENISCI  Medial meniscus:  Intact.  Lateral meniscus: Small vertical tear along the superior surface of the anterior horn-body junction of the lateral meniscus. 7.7 x 2.5 x 5 mm multiloculated cystic structure along the periphery of the lateral meniscus most consistent  with a parameniscal cyst.  LIGAMENTS  Cruciates:  Intact ACL and PCL.  Collaterals: Medial collateral ligament is intact. Lateral collateral ligament complex is intact.  CARTILAGE  Patellofemoral:  No chondral defect.  Medial:  No chondral defect.  Lateral:   No chondral defect.  Joint:  No joint effusion. Normal Hoffa's fat. No plical thickening.  Popliteal Fossa:  No Baker cyst. Intact popliteus tendon.  Extensor Mechanism: Intact quadriceps tendon. Intact patellar tendon. Intact medial patellar retinaculum. Intact lateral patellar retinaculum. Intact MPFL.  Bones: No acute osseous abnormality. No aggressive osseous lesion. Small cystic lesion in the distal medial femoral condyle most consistent with a intraosseous cyst.  Other: Muscles are normal.  No fluid collection or hematoma.  IMPRESSION: Small vertical tear along the superior surface of the anterior horn-body junction of the lateral meniscus. 7.7 x 2.5 x 5 mm multiloculated cystic structure along the periphery of the lateral meniscus most consistent with a parameniscal cyst.  Assessment/Plan Clinical impression: Lateral meniscal tear with para meniscal cyst left knee  Plan: Arthroscopic debridement of the left knee with partial lateral meniscectomy and possible debridement meniscal cyst of joint  Jacqualine Code, PA-C 10/27/2018, 10:42 AM

## 2018-10-31 ENCOUNTER — Encounter (HOSPITAL_BASED_OUTPATIENT_CLINIC_OR_DEPARTMENT_OTHER): Payer: Self-pay | Admitting: *Deleted

## 2018-10-31 ENCOUNTER — Other Ambulatory Visit: Payer: Self-pay

## 2018-11-03 MED FILL — PROPRANOLOL 40 MG TABLET: 40 | 30 days supply | Qty: 60 | Fill #2

## 2018-11-07 ENCOUNTER — Telehealth (INDEPENDENT_AMBULATORY_CARE_PROVIDER_SITE_OTHER): Payer: Self-pay | Admitting: Orthopaedic Surgery

## 2018-11-07 NOTE — Telephone Encounter (Signed)
I called and faxed disability paperwork to [employer], I called patient

## 2018-11-07 NOTE — Telephone Encounter (Signed)
Patient called checking if Dr. Cleophas Dunker was able to fill out the paperwork for [employer].  Patient is requesting a return call to let her know if she needs to reschedule her surgery that is scheduled for tomorrow 11/08/18.  Patient states she called Debbie to let her know that she might need to reschedule if [employer] doesn't receive all the paperwork and her time off isn't approved.  Patient states a voicemail can be left on her phone to let her know the status.

## 2018-11-08 ENCOUNTER — Encounter (HOSPITAL_BASED_OUTPATIENT_CLINIC_OR_DEPARTMENT_OTHER)
Admission: RE | Disposition: A | Discharge status: Home / Self Care | Payer: Self-pay | Source: Home / Self Care | Attending: Orthopaedic Surgery

## 2018-11-08 ENCOUNTER — Encounter (HOSPITAL_BASED_OUTPATIENT_CLINIC_OR_DEPARTMENT_OTHER): Payer: Self-pay | Admitting: *Deleted

## 2018-11-08 ENCOUNTER — Ambulatory Visit (HOSPITAL_BASED_OUTPATIENT_CLINIC_OR_DEPARTMENT_OTHER): Payer: No Typology Code available for payment source | Admitting: Anesthesiology

## 2018-11-08 ENCOUNTER — Other Ambulatory Visit: Payer: Self-pay

## 2018-11-08 ENCOUNTER — Encounter: Payer: Self-pay | Admitting: Orthopaedic Surgery

## 2018-11-08 ENCOUNTER — Ambulatory Visit (HOSPITAL_BASED_OUTPATIENT_CLINIC_OR_DEPARTMENT_OTHER)
Admission: RE | Admit: 2018-11-08 | Discharge: 2018-11-08 | Disposition: A | Discharge status: Home / Self Care | Payer: No Typology Code available for payment source | Attending: Orthopaedic Surgery | Admitting: Orthopaedic Surgery

## 2018-11-08 DIAGNOSIS — S83289A Other tear of lateral meniscus, current injury, unspecified knee, initial encounter: Secondary | ICD-10-CM | POA: Diagnosis present

## 2018-11-08 DIAGNOSIS — Q686 Discoid meniscus: Secondary | ICD-10-CM | POA: Diagnosis present

## 2018-11-08 DIAGNOSIS — M23361 Other meniscus derangements, other lateral meniscus, right knee: Secondary | ICD-10-CM | POA: Diagnosis not present

## 2018-11-08 DIAGNOSIS — X58XXXA Exposure to other specified factors, initial encounter: Secondary | ICD-10-CM | POA: Insufficient documentation

## 2018-11-08 DIAGNOSIS — S83281A Other tear of lateral meniscus, current injury, right knee, initial encounter: Secondary | ICD-10-CM | POA: Diagnosis present

## 2018-11-08 DIAGNOSIS — S83282D Other tear of lateral meniscus, current injury, left knee, subsequent encounter: Secondary | ICD-10-CM

## 2018-11-08 HISTORY — DX: Unspecified asthma, uncomplicated: J45.909

## 2018-11-08 HISTORY — PX: KNEE ARTHROSCOPY WITH LATERAL MENISECTOMY: SHX6193

## 2018-11-08 HISTORY — DX: Headache: R51

## 2018-11-08 HISTORY — DX: Headache, unspecified: R51.9

## 2018-11-08 LAB — POCT PREGNANCY, URINE: Preg Test, Ur: NEGATIVE

## 2018-11-08 SURGERY — ARTHROSCOPY, KNEE, WITH LATERAL MENISCECTOMY
Anesthesia: General | Site: Knee | Laterality: Right

## 2018-11-08 MED ORDER — MEPERIDINE HCL 25 MG/ML IJ SOLN: 6.2500 mg | INTRAMUSCULAR | Status: DC | PRN

## 2018-11-08 MED ORDER — CEFAZOLIN SODIUM-DEXTROSE 2-4 GM/100ML-% IV SOLN
INTRAVENOUS | Status: AC
  Filled 2018-11-08: qty 100

## 2018-11-08 MED ORDER — FENTANYL CITRATE (PF) 100 MCG/2ML IJ SOLN
50.0000 ug | INTRAMUSCULAR | Status: DC | PRN
  Administered 2018-11-08: 100 ug via INTRAVENOUS

## 2018-11-08 MED ORDER — DEXAMETHASONE SODIUM PHOSPHATE 10 MG/ML IJ SOLN
INTRAMUSCULAR | Status: AC
  Filled 2018-11-08: qty 1

## 2018-11-08 MED ORDER — FENTANYL CITRATE (PF) 100 MCG/2ML IJ SOLN
INTRAMUSCULAR | Status: AC
  Filled 2018-11-08: qty 2

## 2018-11-08 MED ORDER — OXYCODONE HCL 5 MG PO TABS: 5.0000 mg | ORAL_TABLET | Freq: Once | ORAL | Status: DC | PRN

## 2018-11-08 MED ORDER — OXYCODONE HCL 5 MG PO CAPS: 5.0000 mg | ORAL_CAPSULE | ORAL | 0 refills | Status: DC | PRN

## 2018-11-08 MED ORDER — DEXAMETHASONE SODIUM PHOSPHATE 10 MG/ML IJ SOLN
INTRAMUSCULAR | Status: DC | PRN
  Administered 2018-11-08: 10 mg via INTRAVENOUS

## 2018-11-08 MED ORDER — ONDANSETRON HCL 4 MG/2ML IJ SOLN
INTRAMUSCULAR | Status: AC
  Filled 2018-11-08: qty 2

## 2018-11-08 MED ORDER — LIDOCAINE-EPINEPHRINE (PF) 1 %-1:200000 IJ SOLN
INTRAMUSCULAR | Status: AC
  Filled 2018-11-08: qty 30

## 2018-11-08 MED ORDER — MIDAZOLAM HCL 2 MG/2ML IJ SOLN
INTRAMUSCULAR | Status: AC
  Filled 2018-11-08: qty 2

## 2018-11-08 MED ORDER — LIDOCAINE 2% (20 MG/ML) 5 ML SYRINGE
INTRAMUSCULAR | Status: DC | PRN
  Administered 2018-11-08: 80 mg via INTRAVENOUS

## 2018-11-08 MED ORDER — CHLORHEXIDINE GLUCONATE 4 % EX LIQD: 60.0000 mL | Freq: Once | CUTANEOUS | Status: DC

## 2018-11-08 MED ORDER — PROPOFOL 10 MG/ML IV BOLUS
INTRAVENOUS | Status: DC | PRN
  Administered 2018-11-08: 300 mg via INTRAVENOUS

## 2018-11-08 MED ORDER — ONDANSETRON HCL 4 MG/2ML IJ SOLN
INTRAMUSCULAR | Status: DC | PRN
  Administered 2018-11-08: 4 mg via INTRAVENOUS

## 2018-11-08 MED ORDER — OXYCODONE HCL 5 MG/5ML PO SOLN: 5.0000 mg | Freq: Once | ORAL | Status: DC | PRN

## 2018-11-08 MED ORDER — MIDAZOLAM HCL 2 MG/2ML IJ SOLN
1.0000 mg | INTRAMUSCULAR | Status: DC | PRN
  Administered 2018-11-08: 2 mg via INTRAVENOUS

## 2018-11-08 MED ORDER — PROMETHAZINE HCL 25 MG/ML IJ SOLN: 6.2500 mg | INTRAMUSCULAR | Status: DC | PRN

## 2018-11-08 MED ORDER — SCOPOLAMINE 1 MG/3DAYS TD PT72: 1.0000 | MEDICATED_PATCH | Freq: Once | TRANSDERMAL | Status: DC | PRN

## 2018-11-08 MED ORDER — POVIDONE-IODINE 10 % EX SWAB: 2.0000 "application " | Freq: Once | CUTANEOUS | Status: DC

## 2018-11-08 MED ORDER — EPHEDRINE 5 MG/ML INJ
INTRAVENOUS | Status: AC
  Filled 2018-11-08: qty 10

## 2018-11-08 MED ORDER — SODIUM CHLORIDE 0.9 % IV SOLN: INTRAVENOUS | Status: DC

## 2018-11-08 MED ORDER — FENTANYL CITRATE (PF) 100 MCG/2ML IJ SOLN
25.0000 ug | INTRAMUSCULAR | Status: DC | PRN
  Administered 2018-11-08 (×2): 25 ug via INTRAVENOUS

## 2018-11-08 MED ORDER — PROPOFOL 10 MG/ML IV BOLUS
INTRAVENOUS | Status: AC
  Filled 2018-11-08: qty 20

## 2018-11-08 MED ORDER — BUPIVACAINE-EPINEPHRINE 0.25% -1:200000 IJ SOLN
INTRAMUSCULAR | Status: DC | PRN
  Administered 2018-11-08: 20 mL

## 2018-11-08 MED ORDER — BUPIVACAINE-EPINEPHRINE (PF) 0.25% -1:200000 IJ SOLN
INTRAMUSCULAR | Status: AC
  Filled 2018-11-08: qty 30

## 2018-11-08 MED ORDER — SODIUM CHLORIDE 0.9 % IR SOLN
Status: DC | PRN
  Administered 2018-11-08: 3000 mL

## 2018-11-08 MED ORDER — LACTATED RINGERS IV SOLN
INTRAVENOUS | Status: DC
  Administered 2018-11-08 (×2): via INTRAVENOUS

## 2018-11-08 MED FILL — oxyCODONE HCL 5 MG TABS: 5 | 6 days supply | Qty: 40 | Fill #0

## 2018-11-08 SURGICAL SUPPLY — 35 items
BANDAGE ACE 6X5 VEL STRL LF (GAUZE/BANDAGES/DRESSINGS) ×3 IMPLANT
BLADE 4.2CUDA (BLADE) IMPLANT
BLADE CUDA SHAVER 3.5 (BLADE) ×3 IMPLANT
BLADE CUTTER GATOR 3.5 (BLADE) IMPLANT
BLADE GREAT WHITE 4.2 (BLADE) IMPLANT
BLADE GREAT WHITE 4.2MM (BLADE)
BNDG GAUZE ELAST 4 BULKY (GAUZE/BANDAGES/DRESSINGS) ×3 IMPLANT
COVER WAND RF STERILE (DRAPES) IMPLANT
DRAPE ARTHROSCOPY W/POUCH 90 (DRAPES) ×3 IMPLANT
DRSG EMULSION OIL 3X3 NADH (GAUZE/BANDAGES/DRESSINGS) ×3 IMPLANT
DURAPREP 26ML APPLICATOR (WOUND CARE) ×3 IMPLANT
ELECT MENISCUS 165MM 90D (ELECTRODE) IMPLANT
ELECT REM PT RETURN 9FT ADLT (ELECTROSURGICAL)
ELECTRODE REM PT RTRN 9FT ADLT (ELECTROSURGICAL) IMPLANT
GLOVE BIO SURGEON STRL SZ7 (GLOVE) ×2 IMPLANT
GLOVE BIOGEL PI IND STRL 7.5 (GLOVE) IMPLANT
GLOVE BIOGEL PI IND STRL 8.5 (GLOVE) IMPLANT
GLOVE BIOGEL PI INDICATOR 7.5 (GLOVE) ×2
GLOVE BIOGEL PI INDICATOR 8.5 (GLOVE) ×4
GLOVE ECLIPSE 8.0 STRL XLNG CF (GLOVE) ×3 IMPLANT
GLOVE SURG ORTHO 8.0 STRL STRW (GLOVE) ×2 IMPLANT
GOWN STRL REUS W/ TWL LRG LVL3 (GOWN DISPOSABLE) ×1 IMPLANT
GOWN STRL REUS W/TWL 2XL LVL3 (GOWN DISPOSABLE) ×3 IMPLANT
GOWN STRL REUS W/TWL LRG LVL3 (GOWN DISPOSABLE) ×2
HOLDER KNEE FOAM BLUE (MISCELLANEOUS) ×3 IMPLANT
KNEE WRAP E Z 3 GEL PACK (MISCELLANEOUS) ×3 IMPLANT
MANIFOLD NEPTUNE II (INSTRUMENTS) ×2 IMPLANT
PACK ARTHROSCOPY DSU (CUSTOM PROCEDURE TRAY) ×3 IMPLANT
PACK BASIN DAY SURGERY FS (CUSTOM PROCEDURE TRAY) ×3 IMPLANT
PENCIL BUTTON HOLSTER BLD 10FT (ELECTRODE) IMPLANT
PROBE BIPOLAR ATHRO 135MM 90D (MISCELLANEOUS) ×3 IMPLANT
SUT ETHILON 4 0 PS 2 18 (SUTURE) IMPLANT
TOWEL GREEN STERILE FF (TOWEL DISPOSABLE) ×3 IMPLANT
TUBING ARTHRO INFLOW-ONLY STRL (TUBING) ×3 IMPLANT
WATER STERILE IRR 1000ML POUR (IV SOLUTION) ×3 IMPLANT

## 2018-11-08 NOTE — Op Note (Signed)
[name], [name] MEDICAL RECORD [id number] ACCOUNT [id number] DATE OF BIRTH:07-16-1996 FACILITY: [facility] LOCATION: [place], [place]  OPERATIVE REPORT  DATE OF PROCEDURE:  11/08/2018  PREOPERATIVE DIAGNOSIS:  Tear lateral meniscus, right knee, with associated lateral parameniscal cyst.  POSTOPERATIVE DIAGNOSIS:  Tear lateral meniscus, right knee, with associated lateral parameniscal cyst.  PROCEDURE: 1.  Diagnostic arthroscopy, right knee. 2.  Excision of radial tear of lateral meniscus and debridement of meniscal cyst.  SURGEON:  Joni Fears, MD  ASSISTANT:  Biagio Borg, PA-C.  ANESTHESIA:  General laryngeal with local knee block.  ESTIMATED  COMPLICATIONS:  None.  HISTORY:  A 23 year old female notes that she has had trouble with her right knee for "years."  She has had a prior left knee arthroscopy seven to eight years ago for an apparent meniscal tear.  Surgery was performed elsewhere.  She feels like she is  having a similar problem with her right knee.  She can actually feel a small mass laterally that does transilluminate and appears to be consistent with a meniscal cyst.  Because of her chronic history, an MRI scan was performed demonstrating a radial  tear of the midportion of the lateral meniscus associated with a small 7.5 mm x 5 mm meniscal cyst.  The patient is now to have an arthroscopic evaluation.  DESCRIPTION OF PROCEDURE:  The patient was met in the holding area and identified the right knee as appropriate operative site and marked it accordingly.  The patient was then transported to room 5.  After being carefully placed on the operating table,  general laryngeal anesthesia was induced without difficulty.  The right lower extremity was then placed in a thigh holder.  The leg was then prepped with chlorhexidine scrub and DuraPrep from the thigh holder to the ankle.  Sterile draping was performed.  Timeout was called.  I  infiltrated the arthroscopic portals on either side of the patella tendon with 0.25% Marcaine with epinephrine.  Small puncture sites were then obtained with a #15 blade knife.  The arthroscope was initially placed through the lateral portal.  Diagnostic arthroscopy revealed no effusion.  I did not see any pathology in the superior pouch.  There was no  chondromalacia of the patellofemoral articulation or loose bodies.  No plica was identified.  Medial compartment was clear of meniscal pathology and chondromalacia.  ACL and PCL appeared to be intact.  In the lateral compartment, the meniscus was evaluated.  It appeared that had a discoid appearance.  There was a radial tear extending about a centimeter and at the mid portion.  This was debrided with the basket forceps, a Cuda shaver and then tapered  using the Arthrocare wand.  No further tearing was identified.  I could palpate the meniscal cyst as identified by the MRI scan and this was punctured several times with a needle.  There was some cystic fluid that was identified within the joint at that  point.  I felt like it had been adequately decompressed.  The joint was then explored for evidence of bleeding.  The two arthroscopic portals were infiltrated with 0.25% Marcaine with epinephrine.  Sterile bulky dressing was applied followed by an Ace bandage.  PLAN:  Crutches, weightbearing as tolerated.  Office first of the week.  Oxycodone for pain.  TN/NUANCE  D:11/08/2018 T:11/08/2018 JOB:[id number]

## 2018-11-08 NOTE — Op Note (Signed)
PATIENT ID:      Rachel [name]  MRN:     [id number] DOB/AGE:    February 23, 1996 / 23 y.o.       OPERATIVE REPORT    DATE OF PROCEDURE:  11/08/2018       PREOPERATIVE DIAGNOSIS:   torn lateral meniscus right knee, meniscal cyst lateral                                                       Estimated body mass index is 26.49 kg/m as calculated from the following:   Height as of this encounter: 5\' 2"  (1.575 m).   Weight as of this encounter: 65.7 kg.     POSTOPERATIVE DIAGNOSIS:   torn lateral meniscus right knee, meniscal cyst lateral                                                                     Estimated body mass index is 26.49 kg/m as calculated from the following:   Height as of this encounter: 5\' 2"  (1.575 m).   Weight as of this encounter: 65.7 kg.     PROCEDURE:  Procedure(s): RIGHT KNEE ARTHROSCOPY WITH LATERAL MENISECTOMY, DEBRIDEMENT MENISCAL CYST LATERAL RIGHT KNEE     SURGEON:  Norlene Campbell, MD    ASSISTANT:   Jacqualine Code, PA-C   (Present and scrubbed throughout the case, critical for assistance with exposure, retraction, instrumentation, and closure.)          ANESTHESIA: general     DRAINS: none :      TOURNIQUET TIME: * No tourniquets in log *    COMPLICATIONS:  None   CONDITION:  stable  PROCEDURE IN DETAIL:    [name] 11/08/2018, 1:24 PM

## 2018-11-08 NOTE — Anesthesia Preprocedure Evaluation (Signed)
Anesthesia Evaluation  Patient identified by MRN, date of birth, ID band Patient awake    Reviewed: Allergy & Precautions, NPO status , Patient's Chart, lab work & pertinent test results  Airway Mallampati: II  TM Distance: >3 FB Neck ROM: Full    Dental  (+) Dental Advisory Given, Teeth Intact   Pulmonary asthma ,    Pulmonary exam normal breath sounds clear to auscultation       Cardiovascular negative cardio ROS Normal cardiovascular exam Rhythm:Regular Rate:Normal     Neuro/Psych  Headaches, negative psych ROS   GI/Hepatic negative GI ROS, Neg liver ROS,   Endo/Other  negative endocrine ROS  Renal/GU negative Renal ROS     Musculoskeletal negative musculoskeletal ROS (+)   Abdominal   Peds  Hematology negative hematology ROS (+)   Anesthesia Other Findings   Reproductive/Obstetrics negative OB ROS                             Anesthesia Physical Anesthesia Plan  ASA: I  Anesthesia Plan: General   Post-op Pain Management:    Induction: Intravenous  PONV Risk Score and Plan: 4 or greater and Ondansetron, Dexamethasone, Midazolam and Treatment may vary due to age or medical condition  Airway Management Planned: LMA  Additional Equipment: None  Intra-op Plan:   Post-operative Plan: Extubation in OR  Informed Consent: I have reviewed the patients History and Physical, chart, labs and discussed the procedure including the risks, benefits and alternatives for the proposed anesthesia with the patient or authorized representative who has indicated his/her understanding and acceptance.     Dental advisory given  Plan Discussed with: CRNA  Anesthesia Plan Comments:         Anesthesia Quick Evaluation

## 2018-11-08 NOTE — Discharge Instructions (Signed)

## 2018-11-08 NOTE — Transfer of Care (Signed)
Immediate Anesthesia Transfer of Care Note  Patient: Rachel [name]  Procedure(s) Performed: RIGHT KNEE ARTHROSCOPY WITH LATERAL MENISECTOMY, EXCISION MENISCAL CYST LATERAL (Right Knee)  Patient Location: PACU  Anesthesia Type:General  Level of Consciousness: drowsy and patient cooperative  Airway & Oxygen Therapy: Patient Spontanous Breathing and Patient connected to face mask oxygen  Post-op Assessment: Report given to RN and Post -op Vital signs reviewed and stable  Post vital signs: Reviewed and stable  Last Vitals:  Vitals Value Taken Time  BP    Temp    Pulse 65 11/08/2018  1:30 PM  Resp 12 11/08/2018  1:30 PM  SpO2 100 % 11/08/2018  1:30 PM  Vitals shown include unvalidated device data.  Last Pain:  Vitals:   11/08/18 1043  TempSrc: Oral  PainSc: 3       Patients Stated Pain Goal: 3 (11/08/18 1043)  Complications: No apparent anesthesia complications

## 2018-11-08 NOTE — Op Note (Signed)
PATIENT ID:      Rachel [name]  MRN:     [id number] DOB/AGE:    November 04, 1995 / 23 y.o.       OPERATIVE REPORT    DATE OF PROCEDURE:  11/08/2018       PREOPERATIVE DIAGNOSIS:   torn lateral meniscus right knee, meniscal cyst lateral                                                       Estimated body mass index is 26.49 kg/m as calculated from the following:   Height as of this encounter: 5\' 2"  (1.575 m).   Weight as of this encounter: 65.7 kg.     POSTOPERATIVE DIAGNOSIS:   torn lateral meniscus right knee, meniscal cyst lateral                                                                     Estimated body mass index is 26.49 kg/m as calculated from the following:   Height as of this encounter: 5\' 2"  (1.575 m).   Weight as of this encounter: 65.7 kg.     PROCEDURE:  Procedure(s): RIGHT KNEE ARTHROSCOPY WITH LATERAL MENISECTOMY, DEBRIDEMENT MENISCAL CYST LATERAL     SURGEON:  Norlene Campbell, MD    ASSISTANT:   Jacqualine Code, PA-C   (Present and scrubbed throughout the case, critical for assistance with exposure, retraction, instrumentation, and closure.)          ANESTHESIA: local and general     DRAINS: none :      TOURNIQUET TIME: * No tourniquets in log *    COMPLICATIONS:  None   CONDITION:  stable  PROCEDURE IN DETAIL: [id number]   Rachel [name] 11/08/2018, 1:30 PM

## 2018-11-08 NOTE — Anesthesia Procedure Notes (Signed)
Procedure Name: LMA Insertion Date/Time: 11/08/2018 12:48 PM Performed by: Ronnette Hila, CRNA Pre-anesthesia Checklist: Patient identified, Emergency Drugs available, Suction available and Patient being monitored Patient Re-evaluated:Patient Re-evaluated prior to induction Oxygen Delivery Method: Circle system utilized Preoxygenation: Pre-oxygenation with 100% oxygen Induction Type: IV induction Ventilation: Mask ventilation without difficulty LMA: LMA inserted LMA Size: 4.0 Tube size: 3.0 mm Number of attempts: 1 Airway Equipment and Method: Bite block Placement Confirmation: positive ETCO2 Tube secured with: Tape Dental Injury: Teeth and Oropharynx as per pre-operative assessment

## 2018-11-09 ENCOUNTER — Encounter (HOSPITAL_BASED_OUTPATIENT_CLINIC_OR_DEPARTMENT_OTHER): Payer: Self-pay | Admitting: Orthopaedic Surgery

## 2018-11-09 NOTE — Anesthesia Postprocedure Evaluation (Signed)
Anesthesia Post Note  Patient: Rachel [name]  Procedure(s) Performed: RIGHT KNEE ARTHROSCOPY WITH LATERAL MENISECTOMY, EXCISION MENISCAL CYST LATERAL (Right Knee)     Patient location during evaluation: PACU Anesthesia Type: General Level of consciousness: sedated and patient cooperative Pain management: pain level controlled Vital Signs Assessment: post-procedure vital signs reviewed and stable Respiratory status: spontaneous breathing Cardiovascular status: stable Anesthetic complications: no    Last Vitals:  Vitals:   11/08/18 1415 11/08/18 1448  BP: 122/77 120/82  Pulse: 87 88  Resp: 15 12  Temp:  36.6 C  SpO2: 100% 100%    Last Pain:  Vitals:   11/08/18 1448  TempSrc:   PainSc: 0-No pain                 Lewie Loron

## 2018-11-14 ENCOUNTER — Inpatient Hospital Stay (INDEPENDENT_AMBULATORY_CARE_PROVIDER_SITE_OTHER): Payer: No Typology Code available for payment source | Admitting: Orthopaedic Surgery

## 2018-11-15 ENCOUNTER — Encounter (INDEPENDENT_AMBULATORY_CARE_PROVIDER_SITE_OTHER): Payer: Self-pay | Admitting: Orthopaedic Surgery

## 2018-11-15 ENCOUNTER — Inpatient Hospital Stay (INDEPENDENT_AMBULATORY_CARE_PROVIDER_SITE_OTHER): Payer: No Typology Code available for payment source | Admitting: Orthopaedic Surgery

## 2018-11-15 ENCOUNTER — Ambulatory Visit (INDEPENDENT_AMBULATORY_CARE_PROVIDER_SITE_OTHER): Payer: No Typology Code available for payment source | Admitting: Orthopaedic Surgery

## 2018-11-15 VITALS — BP 122/94 | HR 104

## 2018-11-15 DIAGNOSIS — G8929 Other chronic pain: Secondary | ICD-10-CM

## 2018-11-15 DIAGNOSIS — M25562 Pain in left knee: Secondary | ICD-10-CM

## 2018-11-15 NOTE — Progress Notes (Signed)
Office Visit Note   Patient: Rachel [name]           Date of Birth: 10/27/1995           MRN: [id number] Visit Date: 11/15/2018              Requested by: Wynn BankerKoberlein, Junell C, MD [address] [place], [place] [id number] PCP: Wynn BankerKoberlein, Junell C, MD   Assessment & Plan: Visit Diagnoses:  1. Chronic pain of left knee     Plan: 1 week status post arthroscopic debridement of a left lateral meniscal tear.  Also had decompression of a lateral meniscal cyst.  Doing well without fever or chills.  Still using crutches and somewhat hesitant to flex her knee.  Will encourage range of motion exercises and weightbearing as tolerated reevaluate in a week.  Consider therapy at that point  Follow-Up Instructions: Return in about 1 week (around 11/22/2018).   Orders:  No orders of the defined types were placed in this encounter.  No orders of the defined types were placed in this encounter.     Procedures: No procedures performed   Clinical Data: No additional findings.   Subjective: Chief Complaint  Patient presents with  . Knee Pain    Postop 11-08-18 Rt knee--swollen, discoloration(blue) from knee to ankle, worse when standing  No related fever or chills.  HPI  Review of Systems   Objective: Vital Signs: BP (!) 122/94 (BP Location: Left Arm, Patient Position: Sitting, Cuff Size: Normal)   Pulse (!) 104   LMP 10/31/2018 (Exact Date) Comment: urine preg negative 11/08/2018  Physical Exam  Ortho Exam arthroscopic portals left knee healing without problem.  No effusion.  No calf pain.  No distal edema.  Hesitant to flex knee only because of concern for.  Full knee extension.  Some areas of decreased sensibility that are non-dermatomal  Specialty Comments:  No specialty comments available.  Imaging: No results found.   PMFS History: Patient Active Problem List   Diagnosis Date Noted  . 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 Lateral meniscus tear left  11/08/2018  . Migraine 07/06/2018  . Anxiety 07/06/2018   Past Medical History:  Diagnosis Date  . Asthma    as a child   . Headache     Family History  Problem Relation Age of Onset  . Bipolar disorder Mother   . Schizophrenia Mother        significant paranoia  . OCD Mother   . Migraines Mother   . Atrial fibrillation Father   . High Cholesterol Father   . Migraines Sister   . CAD Maternal Grandmother   . Vascular Disease Maternal Grandmother   . Mental illness Paternal Grandmother   . Migraines Sister   . Migraines Sister   . High Cholesterol Paternal Grandfather   . Heart disease Paternal Grandfather   . Migraines Paternal Grandfather     Past Surgical History:  Procedure Laterality Date  . KNEE ARTHROSCOPY Left 2013   meniscus repair  . KNEE ARTHROSCOPY WITH LATERAL MENISECTOMY Right 11/08/2018   Procedure: RIGHT KNEE ARTHROSCOPY WITH LATERAL MENISECTOMY, EXCISION MENISCAL CYST LATERAL;  Surgeon: Valeria BatmanWhitfield, Ka Bench W, MD;  Location: [facility];  Service: Orthopedics;  Laterality: Right;   Social History   Occupational History  . Not on file  Tobacco Use  . Smoking status: Never Smoker  . Smokeless tobacco: Never Used  Substance and Sexual Activity  . Alcohol use: Yes  Comment: occasional  . Drug use: Never  . Sexual activity: Yes    Partners: Male     Valeria BatmanPeter W Kingstyn Deruiter, MD   Note - This record has been created using [employer] software.  Chart creation errors have been sought, but may not always  have been located. Such creation errors do not reflect on  the standard of medical care.

## 2018-11-15 NOTE — Progress Notes (Deleted)
   Office Visit Note   Patient: Rachel [name]           Date of Birth: 1996/03/25           MRN: [id number] Visit Date: 11/15/2018              Requested by: Wynn Banker, MD [address] [place], [place] [address] PCP: Wynn Banker, MD   Assessment & Plan: Visit Diagnoses: No diagnosis found.  Plan: ***  Follow-Up Instructions: No follow-ups on file.   Orders:  No orders of the defined types were placed in this encounter.  No orders of the defined types were placed in this encounter.     Procedures: No procedures performed   Clinical Data: No additional findings.   Subjective: Chief Complaint  Patient presents with  . Knee Pain    Postop 11-08-18 Rt knee--swollen, discoloration(blue) from knee to ankle, worse when standing    HPI  Review of Systems  Constitutional: Negative.   HENT: Negative.   Eyes: Negative.   Respiratory: Negative.   Cardiovascular: Negative.   Gastrointestinal: Negative.   Endocrine: Negative.   Genitourinary: Negative.   Musculoskeletal: Positive for gait problem and joint swelling.  Skin: Positive for color change.  Allergic/Immunologic: Negative.   Hematological: Negative.   Psychiatric/Behavioral: Negative.      Objective: Vital Signs: LMP 10/31/2018 (Exact Date) Comment: urine preg negative 11/08/2018  Physical Exam  Ortho Exam  Specialty Comments:  No specialty comments available.  Imaging: No results found.   PMFS History: Patient Active Problem List   Diagnosis Date Noted  . 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 Lateral meniscus tear left 11/08/2018  . Migraine 07/06/2018  . Anxiety 07/06/2018   Past Medical History:  Diagnosis Date  . Asthma    as a child   . Headache     Family History  Problem Relation Age of Onset  . Bipolar disorder Mother   . Schizophrenia Mother        significant paranoia  . OCD Mother   . Migraines Mother   . Atrial fibrillation Father     . High Cholesterol Father   . Migraines Sister   . CAD Maternal Grandmother   . Vascular Disease Maternal Grandmother   . Mental illness Paternal Grandmother   . Migraines Sister   . Migraines Sister   . High Cholesterol Paternal Grandfather   . Heart disease Paternal Grandfather   . Migraines Paternal Grandfather     Past Surgical History:  Procedure Laterality Date  . KNEE ARTHROSCOPY Left 2013   meniscus repair  . KNEE ARTHROSCOPY WITH LATERAL MENISECTOMY Right 11/08/2018   Procedure: RIGHT KNEE ARTHROSCOPY WITH LATERAL MENISECTOMY, EXCISION MENISCAL CYST LATERAL;  Surgeon: [name], MD;  Location: [facility];  Service: Orthopedics;  Laterality: Right;   Social History   Occupational History  . Not on file  Tobacco Use  . Smoking status: Never Smoker  . Smokeless tobacco: Never Used  Substance and Sexual Activity  . Alcohol use: Yes    Comment: occasional  . Drug use: Never  . Sexual activity: Yes    Partners: Male

## 2018-11-22 ENCOUNTER — Encounter (INDEPENDENT_AMBULATORY_CARE_PROVIDER_SITE_OTHER): Payer: Self-pay | Admitting: Orthopaedic Surgery

## 2018-11-22 ENCOUNTER — Ambulatory Visit (INDEPENDENT_AMBULATORY_CARE_PROVIDER_SITE_OTHER): Payer: No Typology Code available for payment source | Admitting: Orthopaedic Surgery

## 2018-11-22 VITALS — BP 117/75 | HR 59 | Resp 12 | Ht 62.0 in | Wt 140.0 lb

## 2018-11-22 DIAGNOSIS — M25561 Pain in right knee: Secondary | ICD-10-CM

## 2018-11-22 DIAGNOSIS — G8929 Other chronic pain: Secondary | ICD-10-CM

## 2018-11-22 NOTE — Progress Notes (Signed)
Office Visit Note   Patient: Rachel [name]           Date of Birth: December 02, 1995           MRN: [id number] Visit Date: 11/22/2018              Requested by: Wynn Banker, MD [address] [place], [place] [address] PCP: Wynn Banker, MD   Assessment & Plan: Visit Diagnoses:  1. Chronic pain of right knee     Plan: 2-week status post arthroscopic debridement of a lateral meniscal tear right knee decompression of lateral meniscal cyst.  Doing well without shortness of breath chest pain or calf discomfort.  No evidence of infection.  No effusion.  Still having some discomfort which I suspect will resolve over time will urged her to work on her exercises and return in a month  Follow-Up Instructions: Return in about 1 month (around 12/23/2018).   Orders:  No orders of the defined types were placed in this encounter.  No orders of the defined types were placed in this encounter.     Procedures: No procedures performed   Clinical Data: No additional findings.   Subjective: No chief complaint on file. Rachel [name] presents in the office today 2 weeks post op. Patient states she has been doing really well up until about 3 days ago. Patient states for the past several days, she has had an increase in pain and unable to bend her knee at 90 degrees and straighten it without pain. Patient states her knee cap is still numb but swelling has decreased. Patient is only taking oxycodone strictly as needed.   HPI  Review of Systems  Constitutional: Negative for chills, fatigue and fever.  HENT: Negative for sore throat and tinnitus.   Eyes: Negative for pain and redness.  Respiratory: Negative for shortness of breath and wheezing.   Cardiovascular: Negative for leg swelling.  Gastrointestinal: Negative for abdominal pain, constipation and diarrhea.  Endocrine: Negative for polyuria.  Genitourinary: Negative for pelvic pain and urgency.  Musculoskeletal: Negative for  back pain and joint swelling.  Skin: Negative for rash.  Allergic/Immunologic: Negative for immunocompromised state.  Neurological: Positive for headaches. Negative for dizziness and light-headedness.  Hematological: Bruises/bleeds easily.  Psychiatric/Behavioral: Negative for confusion. The patient is not nervous/anxious.      Objective: Vital Signs: BP 117/75 (BP Location: Right Arm, Patient Position: Sitting, Cuff Size: Normal)   Pulse (!) 59   Resp 12   Ht 5\' 2"  (1.575 m)   Wt 140 lb (63.5 kg)   LMP 10/31/2018 (Exact Date) Comment: urine preg negative 11/08/2018  BMI 25.61 kg/m   Physical Exam  Ortho Exam right knee without effusion.  No evidence of infection.  Arthroscopic portals of healed nicely.  Walks with just a minimal limp when she first gets up from a sitting position and then it seems to resolve.  No calf pain.  No distal edema.  Neurologically intact.  Full extension flexed over 105 degrees without instability  Specialty Comments:  No specialty comments available.  Imaging: No results found.   PMFS History: Patient Active Problem List   Diagnosis Date Noted  . Chronic pain of right knee 11/22/2018  . 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 Lateral meniscus tear left 11/08/2018  . Migraine 07/06/2018  . Anxiety 07/06/2018   Past Medical History:  Diagnosis Date  . Asthma    as a child   . Headache  Family History  Problem Relation Age of Onset  . Bipolar disorder Mother   . Schizophrenia Mother        significant paranoia  . OCD Mother   . Migraines Mother   . Atrial fibrillation Father   . High Cholesterol Father   . Migraines Sister   . CAD Maternal Grandmother   . Vascular Disease Maternal Grandmother   . Mental illness Paternal Grandmother   . Migraines Sister   . Migraines Sister   . High Cholesterol Paternal Grandfather   . Heart disease Paternal Grandfather   . Migraines Paternal Grandfather     Past Surgical History:    Procedure Laterality Date  . KNEE ARTHROSCOPY Left 2013   meniscus repair  . KNEE ARTHROSCOPY WITH LATERAL MENISECTOMY Right 11/08/2018   Procedure: RIGHT KNEE ARTHROSCOPY WITH LATERAL MENISECTOMY, EXCISION MENISCAL CYST LATERAL;  Surgeon: [name], MD;  Location: [facility];  Service: Orthopedics;  Laterality: Right;   Social History   Occupational History  . Not on file  Tobacco Use  . Smoking status: Never Smoker  . Smokeless tobacco: Never Used  Substance and Sexual Activity  . Alcohol use: Yes    Comment: occasional  . Drug use: Never  . Sexual activity: Yes    Partners: Male

## 2018-11-30 ENCOUNTER — Ambulatory Visit: Payer: No Typology Code available for payment source | Admitting: Family Medicine

## 2018-12-02 ENCOUNTER — Encounter: Payer: Self-pay | Admitting: Neurology

## 2018-12-02 ENCOUNTER — Encounter: Payer: Self-pay | Admitting: Family Medicine

## 2018-12-02 ENCOUNTER — Ambulatory Visit (INDEPENDENT_AMBULATORY_CARE_PROVIDER_SITE_OTHER): Payer: No Typology Code available for payment source | Admitting: Family Medicine

## 2018-12-02 VITALS — BP 122/60 | HR 64 | Temp 98.1°F | Ht 62.0 in | Wt 146.3 lb

## 2018-12-02 DIAGNOSIS — G43809 Other migraine, not intractable, without status migrainosus: Secondary | ICD-10-CM | POA: Diagnosis not present

## 2018-12-02 DIAGNOSIS — M26623 Arthralgia of bilateral temporomandibular joint: Secondary | ICD-10-CM | POA: Diagnosis not present

## 2018-12-02 DIAGNOSIS — G4700 Insomnia, unspecified: Secondary | ICD-10-CM

## 2018-12-02 MED ORDER — PROPRANOLOL HCL ER 80 MG PO CP24: 80.0000 mg | ORAL_CAPSULE | Freq: Every day | ORAL | 2 refills | Status: DC

## 2018-12-02 MED ORDER — CYCLOBENZAPRINE HCL 10 MG PO TABS: 10.0000 mg | ORAL_TABLET | Freq: Every evening | ORAL | 2 refills | Status: DC | PRN

## 2018-12-02 MED FILL — CYCLOBENZAPRINE HCL 10 MG T: 10 | 30 days supply | Qty: 30 | Fill #0

## 2018-12-02 MED FILL — PROPRANOLOL ER 80 MG CAP: 80 | 30 days supply | Qty: 30 | Fill #0

## 2018-12-02 NOTE — Progress Notes (Signed)
Rachel [name] DOB: 11/16/1995 Encounter date: 12/02/2018  This is a 23 y.o. female who presents for follow up for migraines.  History of present illness/Additional concerns: At last visit we started propranolol for migraine prophylaxis but also referred to headache specialist. Thinks maybe it lessened some with medication, but still about the same. Notes more on period weeks. Lasts longer and more pain during that week than on weeks she is not on medications. Notes any time she lays head down for an hour or so, she gets a headache (napping or sleeping). Feels like if she wakes up early she doesn't have headache. Going back to sleep then she feels like she gets a headache. Headache center didn't take her insurance so she could not see them. Has had headaches since middle school. Taking the propranolol BID; taking the riboflavin. Only taking the imitrex if severe; tries to avoid. Takes it just once or twice per month (usually during period week). Nausea in the morning, most mornings. Worse with the severe migraines. Notes that headache is dependent on how she lays. It has always been this way. If lying on side pain will move to that side; notes that in side/back positions pain is in back. Otherwise can feel in front. Thinks that overall the severe migraines are less (noted improvement with starting ocp). No jaw pain; not sure if clenching teeth - there are teeth marks on retainer in morning.   Sleep has somewhat always been issue/schedule has always been off. When younger would sleep 12 hours at a time. Works long shifts now; has hard time with keeping schedule regular. Soft snoring only. Wakes easily. Not rested in morning. Feels like "I never slept"  Surgery on knee went ok, but cyst has come back and hurts and is bigger than before.   Neck strain; large breast size contributes, she feels. Wearing 30-32 DDD or F currently. Used to be a G and did lose cup size when she had some intentional weight loss. Was  5536-38 G previously.32 below breast; 35.5 across breasts.  Also states she thought of something that she hadn't mentioned before. Can feel episodes where she gets clammy, sweaty, and then gets weak and sees black spots and has to sit down. If she sits in time she will not pass out. It has been a few years since she has passed out. Will sit down right away if she notes symptoms. Happens maybe a couple of times/month. Doesn't lock knees with standing. Brings this up because mother had episodes of her blacking out and ended up having brain tumor. States that mom also had headaches just when she would lay down; and would avoid lying down for this reason.   Past Medical History:  Diagnosis Date  . Asthma    as a child   . Headache    Past Surgical History:  Procedure Laterality Date  . KNEE ARTHROSCOPY Left 2013   meniscus repair  . KNEE ARTHROSCOPY WITH LATERAL MENISECTOMY Right 11/08/2018   Procedure: RIGHT KNEE ARTHROSCOPY WITH LATERAL MENISECTOMY, EXCISION MENISCAL CYST LATERAL;  Surgeon: Valeria BatmanWhitfield, Peter W, MD;  Location: [facility];  Service: Orthopedics;  Laterality: Right;   Allergies  Allergen Reactions  . Sulfur Hives   Current Meds  Medication Sig  . norethindrone-ethinyl estradiol 1/35 (NORTREL 1/35, 21,) tablet Take 1 tablet by mouth daily.  . SUMAtriptan (IMITREX) 100 MG tablet Take 1 tablet (100 mg total) by mouth daily as needed for migraine. May repeat in 2 hours if  headache persists or recurs not to exceed 2 tablets in 24 hours.  . [DISCONTINUED] propranolol (INDERAL) 40 MG tablet Take 1 tablet (40 mg total) by mouth 2 (two) times daily. (Patient taking differently: Take 40 mg by mouth 2 (two) times daily. )   Social History   Tobacco Use  . Smoking status: Never Smoker  . Smokeless tobacco: Never Used  Substance Use Topics  . Alcohol use: Yes    Comment: occasional   Family History  Problem Relation Age of Onset  . Bipolar disorder Mother   .  Schizophrenia Mother        significant paranoia  . OCD Mother   . Migraines Mother   . Atrial fibrillation Father   . High Cholesterol Father   . Migraines Sister   . CAD Maternal Grandmother   . Vascular Disease Maternal Grandmother   . Mental illness Paternal Grandmother   . Migraines Sister   . Migraines Sister   . High Cholesterol Paternal Grandfather   . Heart disease Paternal Grandfather   . Migraines Paternal Grandfather      Review of Systems  Constitutional: Positive for fatigue (never feels rested when she awakes; feels tired through day). Negative for chills and fever.  Respiratory: Negative for cough, chest tightness, shortness of breath and wheezing.   Cardiovascular: Negative for chest pain, palpitations and leg swelling.  Musculoskeletal: Positive for neck pain (feels that large breast size may contribute. Bra straps indent on shoulders. Also worse with work (manipulating US probe)) and neck stiffness.  Neurological: Positive for headaches. Negative for dizziness and light-headedness.    CBC:  Lab Results  Component Value Date   WBC 3.8 (L) 08/24/2018   HGB 13.9 08/24/2018   HCT 40.8 08/24/2018   MCHC 34.1 08/24/2018   RDW 12.6 08/24/2018   PLT 204.0 08/24/2018   CMP: Lab Results  Component Value Date   NA 140 07/14/2018   K 4.2 07/14/2018   CL 104 07/14/2018   CO2 29 07/14/2018   GLUCOSE 78 07/14/2018   BUN 13 07/14/2018   CREATININE 0.63 07/14/2018   CALCIUM 9.3 07/14/2018   PROT 6.6 07/14/2018   BILITOT 0.5 07/14/2018   ALKPHOS 33 (L) 07/14/2018   ALT 13 07/14/2018   AST 14 07/14/2018   LIPID: Lab Results  Component Value Date   CHOL 127 07/14/2018   TRIG 32.0 07/14/2018   HDL 89.60 07/14/2018   LDLCALC 31 07/14/2018    Objective:  BP 122/60 (BP Location: Left Arm, Patient Position: Sitting, Cuff Size: Normal)   Pulse 64   Temp 98.1 F (36.7 C) (Oral)   Ht 5\' 2"  (1.575 m)   Wt 146 lb 4.8 oz (66.4 kg)   SpO2 98%   BMI 26.76  kg/m   Weight: 146 lb 4.8 oz (66.4 kg)   BP Readings from Last 3 Encounters:  12/02/18 122/60  11/22/18 117/75  11/15/18 (!) 122/94   Wt Readings from Last 3 Encounters:  12/02/18 146 lb 4.8 oz (66.4 kg)  11/22/18 140 lb (63.5 kg)  11/08/18 144 lb 13.5 oz (65.7 kg)    Physical Exam Constitutional:      General: She is not in acute distress.    Appearance: She is well-developed.  HENT:     Mouth/Throat:     Lips: Pink.     Mouth: Mucous membranes are moist.     Tonsils: No tonsillar exudate or tonsillar abscesses.     Comments: bilat TMJ tenderness (more noted  from pressure applied to TMJ from inside mouth) Eyes:     Extraocular Movements: Extraocular movements intact.     Pupils: Pupils are equal, round, and reactive to light.  Neck:     Musculoskeletal: Full passive range of motion without pain. Spinous process tenderness (mild, upper cervical) and muscular tenderness (bilat trap) present.  Cardiovascular:     Rate and Rhythm: Normal rate and regular rhythm.     Heart sounds: Normal heart sounds. No murmur. No friction rub.  Pulmonary:     Effort: Pulmonary effort is normal. No respiratory distress.     Breath sounds: Normal breath sounds. No wheezing or rales.  Musculoskeletal:     Right lower leg: No edema.     Left lower leg: No edema.  Lymphadenopathy:     Cervical: No cervical adenopathy.  Neurological:     Mental Status: She is alert and oriented to person, place, and time.  Psychiatric:        Behavior: Behavior normal.     Assessment/Plan: 1. Other migraine without status migrainosus, not intractable We are referring to neurology for further evaluation.  She has had headaches for years and has had difficulty with getting some baseline control of these.  I am going to change her propranolol to 80 mg long-acting in the evening.  I am hoping this helps with her morning headaches somewhat.  We are also going to trial a muscle relaxer to see if this helps with  morning headaches.  I wonder if there is a component of teeth grinding/TMJ tenderness, as well as some neck tension from work/large breasts that is also contributing to headache.  Encouraged her to update me next week on how this is working. - propranolol ER (INDERAL LA) 80 MG 24 hr capsule; Take 1 capsule (80 mg total) by mouth daily.  Dispense: 30 capsule; Refill: 2 - Ambulatory referral to Neurology  2. Insomnia, unspecified type She has had difficulty with sleep for a long period of time.  I think it is reasonable to get a sleep study since she has never feeling rested after sleep and has frequent awakenings.  This is likely also contributing to headaches. - Ambulatory referral to Sleep Studies  3. TMJ tenderness, bilateral See above - cyclobenzaprine (FLEXERIL) 10 MG tablet; Take 1 tablet (10 mg total) by mouth at bedtime as needed for muscle spasms.  Dispense: 30 tablet; Refill: 2   Return in about 3 months (around 03/02/2019) for Chronic condition visit.  Theodis Shove, MD

## 2018-12-09 ENCOUNTER — Encounter: Payer: Self-pay | Admitting: Family Medicine

## 2018-12-09 ENCOUNTER — Other Ambulatory Visit: Payer: Self-pay | Admitting: Family Medicine

## 2018-12-09 DIAGNOSIS — G4452 New daily persistent headache (NDPH): Secondary | ICD-10-CM

## 2018-12-09 DIAGNOSIS — R42 Dizziness and giddiness: Secondary | ICD-10-CM

## 2018-12-15 ENCOUNTER — Ambulatory Visit (HOSPITAL_COMMUNITY)
Admission: RE | Admit: 2018-12-15 | Discharge: 2018-12-15 | Disposition: A | Discharge status: Home / Self Care | Payer: No Typology Code available for payment source | Source: Ambulatory Visit | Attending: Family Medicine | Admitting: Family Medicine

## 2018-12-15 DIAGNOSIS — G4452 New daily persistent headache (NDPH): Secondary | ICD-10-CM | POA: Insufficient documentation

## 2018-12-15 DIAGNOSIS — R42 Dizziness and giddiness: Secondary | ICD-10-CM | POA: Insufficient documentation

## 2018-12-22 ENCOUNTER — Ambulatory Visit (INDEPENDENT_AMBULATORY_CARE_PROVIDER_SITE_OTHER): Payer: No Typology Code available for payment source | Admitting: Orthopaedic Surgery

## 2018-12-22 ENCOUNTER — Encounter (INDEPENDENT_AMBULATORY_CARE_PROVIDER_SITE_OTHER): Payer: Self-pay | Admitting: Orthopaedic Surgery

## 2018-12-22 VITALS — BP 107/67 | HR 67 | Ht 62.0 in | Wt 140.0 lb

## 2018-12-22 DIAGNOSIS — G8929 Other chronic pain: Secondary | ICD-10-CM

## 2018-12-22 DIAGNOSIS — M25561 Pain in right knee: Secondary | ICD-10-CM | POA: Diagnosis not present

## 2018-12-22 MED ORDER — LIDOCAINE HCL 1 % IJ SOLN
2.0000 mL | INTRAMUSCULAR | Status: AC | PRN
  Administered 2018-12-22: 2 mL

## 2018-12-22 MED ORDER — BUPIVACAINE HCL 0.5 % IJ SOLN
2.0000 mL | INTRAMUSCULAR | Status: AC | PRN
  Administered 2018-12-22: 2 mL via INTRA_ARTICULAR

## 2018-12-22 MED ORDER — METHYLPREDNISOLONE ACETATE 40 MG/ML IJ SUSP
80.0000 mg | INTRAMUSCULAR | Status: AC | PRN
  Administered 2018-12-22: 80 mg via INTRA_ARTICULAR

## 2018-12-22 NOTE — Progress Notes (Signed)
Office Visit Note   Patient: Rachel [name]           Date of Birth: 26-Jun-1996           MRN: [id number] Visit Date: 12/22/2018              Requested by: Wynn Banker, MD [address] [place], [place] [address] PCP: Wynn Banker, MD   Assessment & Plan: Visit Diagnoses:  1. Chronic pain of right knee     Plan: 6 weeks status post right knee arthroscopy.  Tear of a discoid lateral meniscus associated with a lateral meniscal cyst.  The cyst was decompressed and the tear was removed.  Still having some discomfort laterally but tickly when she is on her feet for long period of time.  We will try a course of physical therapy at [facility] long where she works and try cortisone injection in the lateral compartment.  Office 1 month  Follow-Up Instructions: Return in about 1 month (around 01/20/2019).   Orders:  Orders Placed This Encounter  Procedures  . Large Joint Inj: R knee   No orders of the defined types were placed in this encounter.     Procedures: Large Joint Inj: R knee on 12/22/2018 1:03 PM Indications: pain and diagnostic evaluation Details: 25 G 1.5 in needle, anteromedial approach  Arthrogram: No  Medications: 2 mL lidocaine 1 %; 2 mL bupivacaine 0.5 %; 80 mg methylPREDNISolone acetate 40 MG/ML Procedure, treatment alternatives, risks and benefits explained, specific risks discussed. Consent was given by the patient. Immediately prior to procedure a time out was called to verify the correct patient, procedure, equipment, support staff and site/side marked as required. Patient was prepped and draped in the usual sterile fashion.       Clinical Data: No additional findings.   Subjective: Chief Complaint  Patient presents with  . Right Knee - Follow-up    DOS 11/08/18  Patient presents today for follow up on her right knee. She had a right knee scope with lateral meniscectomy on 11/08/18. She said that she has not noticed much progression since  her last visit a month ago. She said that it is still swollen and painful. She usually does not take anything for pain.   HPI  Review of Systems  Constitutional: Positive for fatigue.  HENT: Positive for ear pain.   Eyes: Negative for pain.  Respiratory: Negative for shortness of breath.   Cardiovascular: Negative for leg swelling.  Gastrointestinal: Negative for constipation and diarrhea.  Endocrine: Positive for cold intolerance. Negative for heat intolerance.  Genitourinary: Negative for difficulty urinating.  Musculoskeletal: Positive for joint swelling.  Skin: Negative for rash.  Allergic/Immunologic: Negative for food allergies.  Neurological: Negative for weakness.  Hematological: Does not bruise/bleed easily.  Psychiatric/Behavioral: Positive for sleep disturbance.     Objective: Vital Signs: BP 107/67   Pulse 67   Ht 5\' 2"  (1.575 m)   Wt 140 lb (63.5 kg)   BMI 25.61 kg/m   Physical Exam  Ortho Exam right knee without effusion.  No significant pain along the lateral compartment and I did not feel a popliteal cyst.  No patellar crepitation.  Minimal discomfort along the medial compartment where there was no pathology identified at anoscopy.  Full extension flexed over 105 degrees without instability Specialty Comments:  No specialty comments available.  Imaging: No results found.   PMFS History: Patient Active Problem List   Diagnosis Date Noted  . Chronic pain of  right knee 11/22/2018  . 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 Lateral meniscus tear left 11/08/2018  . Migraine 07/06/2018  . Anxiety 07/06/2018   Past Medical History:  Diagnosis Date  . Asthma    as a child   . Headache     Family History  Problem Relation Age of Onset  . Bipolar disorder Mother   . Schizophrenia Mother        significant paranoia  . OCD Mother   . Migraines Mother   . Atrial fibrillation Father   . High Cholesterol Father   . Migraines Sister   . CAD  Maternal Grandmother   . Vascular Disease Maternal Grandmother   . Mental illness Paternal Grandmother   . Migraines Sister   . Migraines Sister   . High Cholesterol Paternal Grandfather   . Heart disease Paternal Grandfather   . Migraines Paternal Grandfather     Past Surgical History:  Procedure Laterality Date  . KNEE ARTHROSCOPY Left 2013   meniscus repair  . KNEE ARTHROSCOPY WITH LATERAL MENISECTOMY Right 11/08/2018   Procedure: RIGHT KNEE ARTHROSCOPY WITH LATERAL MENISECTOMY, EXCISION MENISCAL CYST LATERAL;  Surgeon: [name], MD;  Location: [facility];  Service: Orthopedics;  Laterality: Right;   Social History   Occupational History  . Not on file  Tobacco Use  . Smoking status: Never Smoker  . Smokeless tobacco: Never Used  Substance and Sexual Activity  . Alcohol use: Yes    Comment: occasional  . Drug use: Never  . Sexual activity: Yes    Partners: Male

## 2018-12-22 NOTE — Addendum Note (Signed)
Addended by: Wendi Maya on: 12/22/2018 01:22 PM   Modules accepted: Orders

## 2018-12-23 ENCOUNTER — Ambulatory Visit: Payer: No Typology Code available for payment source | Attending: Orthopaedic Surgery | Admitting: Physical Therapy

## 2018-12-23 ENCOUNTER — Institutional Professional Consult (permissible substitution): Payer: No Typology Code available for payment source | Admitting: Pulmonary Disease

## 2018-12-23 ENCOUNTER — Encounter: Payer: Self-pay | Admitting: Physical Therapy

## 2018-12-23 DIAGNOSIS — M25662 Stiffness of left knee, not elsewhere classified: Secondary | ICD-10-CM

## 2018-12-23 DIAGNOSIS — R600 Localized edema: Secondary | ICD-10-CM

## 2018-12-23 DIAGNOSIS — M25562 Pain in left knee: Secondary | ICD-10-CM | POA: Diagnosis not present

## 2018-12-23 NOTE — Patient Instructions (Addendum)
  Access Code: [email or web address]  URL: [email or web address]  Date: 12/23/2018  Prepared by: Ivery Quale   Exercises  Supine Heel Slide with Strap - 10 reps - 2 sets - 5 hold - 2x daily - 6x weekly  Prone Quadriceps Stretch with Strap - 3 reps - 1 sets - 30 hold - 2x daily - 6x weekly  Supine ITB Stretch with Strap - 3 reps - 1 sets - 30 hold - 2x daily - 6x weekly  Supine Hip Adduction Isometric with Ball - 10 reps - 3 sets - 2x daily - 6x weekly  Seated Long Arc Quad with Hip Adduction - 10 reps - 3 sets - 2x daily - 6x weekly  Sit to Stand with Ball Between Knees - 10 reps - 1 sets - 5 hold - 2x daily - 6x weekly   TENS UNIT: This is helpful for muscle pain and spasm.   Search and Purchase a TENS 7000 2nd edition at www.tenspros.com. It should be less than $30.     TENS unit instructions: Do not shower or bathe with the unit on Turn the unit off before removing electrodes or batteries If the electrodes lose stickiness add a drop of water to the electrodes after they are disconnected from the unit and place on plastic sheet. If you continued to have difficulty, call the TENS unit company to purchase more electrodes. Do not apply lotion on the skin area prior to use. Make sure the skin is clean and dry as this will help prolong the life of the electrodes. After use, always check skin for unusual red areas, rash or other skin difficulties. If there are any skin problems, does not apply electrodes to the same area. Never remove the electrodes from the unit by pulling the wires. Do not use the TENS unit or electrodes other than as directed. Do not change electrode placement without consultating your therapist or physician. Keep 2 fingers with between each electrode. Wear time ratio is 2:1, on to off times.    For example on for 30 minutes off for 15 minutes and then on for 30 minutes off for 15 minutes

## 2018-12-23 NOTE — Therapy (Signed)
[facility] [address] [place], [place], [address] Phone: [phone]   Fax:  [phone]  Physical Therapy Evaluation  Patient Details  Name: Rachel [name] MRN: [id number] Date of Birth: 1996/02/04 Referring Provider (PT): [name], MD   Encounter Date: 12/23/2018  PT End of Session - 12/23/18 1218    Visit Number  1    Number of Visits  8    Date for PT Re-Evaluation  02/03/19    Authorization Type  [facility] focus plan    PT Start Time  0930    PT Stop Time  1025    PT Time Calculation (min)  55 min    Activity Tolerance  Patient tolerated treatment well    Behavior During Therapy  [facility] for tasks assessed/performed       Past Medical History:  Diagnosis Date  . Asthma    as a child   . Headache     Past Surgical History:  Procedure Laterality Date  . KNEE ARTHROSCOPY Left 2013   meniscus repair  . KNEE ARTHROSCOPY WITH LATERAL MENISECTOMY Right 11/08/2018   Procedure: RIGHT KNEE ARTHROSCOPY WITH LATERAL MENISECTOMY, EXCISION MENISCAL CYST LATERAL;  Surgeon: [name], MD;  Location: [facility];  Service: Orthopedics;  Laterality: Right;    There were no vitals filed for this visit.   Subjective Assessment - 12/23/18 0936    Subjective  Has had 3 years of knee pain and then had Rt knee scope and lateral menisectomy on 11/08/18. She now is still having some anterior lateral knee pain with walking too much, squats, biking, steps or stairs. 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 She also had injection yesterday in her knee which did help some.     Pertinent History  PMH: Lt knee scope 2013, asthma    Limitations  House hold activities;Lifting;Standing;Walking    How long can you stand comfortably?  2 hours    How long can you walk comfortably?  30 min max    Patient Stated Goals  get the pain down and bend my  knee to squat without pain.    Currently in Pain?  Yes    Pain Score  6     Pain Location  Knee    Pain Orientation  Right    Pain Descriptors / Indicators  Sharp    Pain Type  Surgical pain    Pain Radiating Towards  down her calf and into foot only when it gets too bad.     Pain Onset  More than a month ago    Pain Frequency  Constant    Aggravating Factors   prolonged standing or walking, stairs, squatting    Pain Relieving Factors  meds, rest    Multiple Pain Sites  No         [facility] PT Assessment - 12/23/18 0001      Assessment   Medical Diagnosis  S/P Rt knee scope and lateral menisectomy     Referring Provider (PT)  [name], MD    Onset Date/Surgical Date  11/08/18    Next MD Visit  one month    Prior Therapy  PT for Lt knee in past      Precautions   Precautions  Anterior Hip      Restrictions   Weight Bearing  Restrictions  No      Balance Screen   Has the patient fallen in the past 6 months  No      Home Environment   Living Environment  Private residence      Prior Function   Level of Independence  Independent    Vocation  Full time employment    Therapist, sports. tech      Cognition   Overall Cognitive Status  Within Functional Limits for tasks assessed      Observation/Other Assessments   Focus on Therapeutic Outcomes (FOTO)   46% limited, goal 30%      Sensation   Light Touch  Appears Intact      Coordination   Gross Motor Movements are Fluid and Coordinated  Yes      ROM / Strength   AROM / PROM / Strength  AROM;Strength      AROM   AROM Assessment Site  Knee    Right/Left Knee  Right;Left    Right Knee Extension  0    Right Knee Flexion  125    Left Knee Extension  0    Left Knee Flexion  141      Strength   Overall Strength Comments  Rt hip 5/5, Rt knee 4+/5 MMT      Flexibility   Soft Tissue Assessment /Muscle Length  --   tight IT band and quads Rt     Palpation   Patella mobility  decreased mobility on Rt  with slight lateral tracking    Palpation comment  TTP lateral knee      Ambulation/Gait   Gait Comments  WNL gait                Objective measurements completed on examination: See above findings.      [facility] Adult PT Treatment/Exercise - 12/23/18 0001      Exercises   Exercises  Knee/Hip      Knee/Hip Exercises: Stretches   Quad Stretch  Right;2 reps;30 seconds    Quad Stretch Limitations  prone with strap    ITB Stretch  Right;2 reps;30 seconds    ITB Stretch Limitations  supine with strap    Other Knee/Hip Stretches  heelslides AAROM with strap 5 sec X 5      Knee/Hip Exercises: Seated   Long Arc Quad  Strengthening;Right;5 reps    Long Arc Quad Limitations  with ball sq hold 5 sec    Sit to [employer]  5 reps;without UE support   with ball Sq and slow eccentric lower     Knee/Hip Exercises: Supine   Hip Adduction Isometric  Both;5 reps    Hip Adduction Isometric Limitations  hold 5 sec      Modalities   Modalities  Electrical Stimulation;Cryotherapy      Cryotherapy   Number Minutes Cryotherapy  15 Minutes    Cryotherapy Location  Knee    Type of Cryotherapy  Ice pack      Electrical Stimulation   Electrical Stimulation Location  Lt knee    Electrical Stimulation Action  IFC    Electrical Stimulation Parameters  to tolerance pt supine with leg elevated    Electrical Stimulation Goals  Pain      Manual Therapy   Manual therapy comments  patella mobs             PT Education - 12/23/18 1218    Education Details  HEP, POC, TENS  Person(s) Educated  Patient    Methods  Explanation;Demonstration;Verbal cues;Handout    Comprehension  Verbalized understanding;Returned demonstration          PT Long Term Goals - 12/23/18 1225      PT LONG TERM GOAL #1   Title  Pt will be I and compliant with HEP. (6 weeks 02/03/19)    Status  New      PT LONG TERM GOAL #2   Title  Pt will improve FOTO to less than 30% limited. (6 weeks 02/03/19)    Status   New      PT LONG TERM GOAL #3   Title  Pt will improve Rt knee ROM and strength to WNL at least 135 deg knee flexion. (6 weeks 02/03/19)    Status  New      PT LONG TERM GOAL #4   Title  Pt will be able to peform usual activity, work activity, and return to gym activity of jogging and resistance training, hiking, with no more than 2/10 overall pain. (6 weeks 02/03/19)             Plan - 12/23/18 1219    Clinical Impression Statement  Pt presents with Rt knee pain, stiffness and weakness S/P Rt knee scope and lateral menisectomy on 11/08/18. She has good extension ROM but missing some flexion ROM. She has signs of inflammation and was educated about not overdoing things and to use Ice and TENS for pain and inflammation. Gait pattern looks good but she has decreased activity tolerance for prolonged walking, standing, squats, or stairs. She will benefit from skilled PT to address her defecits and return to full function.     History and Personal Factors relevant to plan of care:  PMH: Lt knee scope 2013, asthma    Clinical Presentation  Stable    Clinical Decision Making  Low    Rehab Potential  Excellent    PT Frequency  2x / week   1-2   PT Duration  6 weeks    PT Treatment/Interventions  Cryotherapy;Electrical Stimulation;Iontophoresis 4mg /ml Dexamethasone;Moist Heat;Ultrasound;Stair training;Therapeutic activities;Therapeutic exercise;Neuromuscular re-education;Manual techniques;Passive range of motion;Taping;Joint Manipulations    PT Next Visit Plan  review HEP, address lateral patella tracking and progress activity tolerance as able, she wants to get back to working out, jogging, squatting, stairs.    PT Home Exercise Plan  IT band stretch, quad stretch, heelslides, hip add sq, LAQ with ball sq, sit to stand with ball sq    Consulted and Agree with Plan of Care  Patient       Patient will benefit from skilled therapeutic intervention in order to improve the following deficits and  impairments:  Decreased activity tolerance, Decreased endurance, Decreased range of motion, Decreased strength, Hypomobility, Difficulty walking  Visit Diagnosis: Acute pain of left knee  Stiffness of left knee, not elsewhere classified  Localized edema     Problem List Patient Active Problem List   Diagnosis Date Noted  . Chronic pain of right knee 11/22/2018  . 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 Lateral meniscus tear left 11/08/2018  . Migraine 07/06/2018  . Anxiety 07/06/2018    Birdie Riddle 12/23/2018, 12:29 PM  [facility] [address] [place], [place], [address] Phone: [phone]   Fax:  [phone]  Name: Rachel [name] MRN: [id number] Date of Birth: 1996/07/30

## 2018-12-27 ENCOUNTER — Ambulatory Visit: Payer: No Typology Code available for payment source | Attending: Orthopaedic Surgery | Admitting: Physical Therapy

## 2018-12-27 ENCOUNTER — Encounter: Payer: Self-pay | Admitting: Family Medicine

## 2018-12-27 ENCOUNTER — Encounter: Payer: Self-pay | Admitting: Physical Therapy

## 2018-12-27 DIAGNOSIS — R600 Localized edema: Secondary | ICD-10-CM

## 2018-12-27 DIAGNOSIS — M25662 Stiffness of left knee, not elsewhere classified: Secondary | ICD-10-CM

## 2018-12-27 DIAGNOSIS — M25562 Pain in left knee: Secondary | ICD-10-CM | POA: Diagnosis present

## 2018-12-27 NOTE — Therapy (Signed)
[facility] [address] [place], [place], [address] Phone: [phone]   Fax:  [phone]  Physical Therapy Treatment  Patient Details  Name: Rachel [name] MRN: [id number] Date of Birth: 1995/10/29 Referring Provider (PT): [name], MD   Encounter Date: 12/27/2018  PT End of Session - 12/27/18 1315    Visit Number  2    Number of Visits  8    Date for PT Re-Evaluation  02/03/19    Authorization Type  [facility] focus plan    PT Start Time  0932    PT Stop Time  1016    PT Time Calculation (min)  44 min    Activity Tolerance  Patient tolerated treatment well    Behavior During Therapy  [facility] for tasks assessed/performed       Past Medical History:  Diagnosis Date  . Asthma    as a child   . Headache     Past Surgical History:  Procedure Laterality Date  . KNEE ARTHROSCOPY Left 2013   meniscus repair  . KNEE ARTHROSCOPY WITH LATERAL MENISECTOMY Right 11/08/2018   Procedure: RIGHT KNEE ARTHROSCOPY WITH LATERAL MENISECTOMY, EXCISION MENISCAL CYST LATERAL;  Surgeon: [name], MD;  [facility];  Service: Orthopedics;  Laterality: Right;    There were no vitals filed for this visit.  Subjective Assessment - 12/27/18 0936    Subjective  Pain flares with activities.  Injection helped.  Has been riding the bike 30 minutes and it hurts sometimes and sometimes it does fine.  It still swells.  I have been doing the exrecises    Currently in Pain?  Yes    Pain Score  6     Pain Location  Knee    Pain Orientation  Right    Pain Descriptors / Indicators  Sore;Aching   stiff   Pain Radiating Towards  into calf and foot    Pain Frequency  Constant    Aggravating Factors   longer    Pain Relieving Factors  elevation,  rest.      Effect of Pain on Daily Activities  Not                        [facility] Adult PT Treatment/Exercise - 12/27/18 0001      Self-Care   Self-Care  RICE    still important to do     Knee/Hip Exercises: Stretches   Gastroc Stretch  3 reps;30 seconds;Both      Knee/Hip Exercises: Aerobic   Recumbent Bike  5 minutes L1      Knee/Hip Exercises: Standing   Heel Raises  20 reps;Right    Lateral Step Up  10 reps;Step Height: 4";Hand Hold: 2    Functional Squat Limitations  mini squat       Knee/Hip Exercises: Supine   Quad Sets Limitations  lateral tracking      Knee/Hip Exercises: Prone   Other Prone Exercises  quad set    Other Prone Exercises  AA kneee flexion  with strap.  pillow tummy,  strap  cues.        Cryotherapy   Type of Cryotherapy  --   declined     Manual Therapy   Manual Therapy  Edema management;Taping    Manual therapy comments  patella mobs   with moist heat concurrent   Edema Management  retrograde,  lymph system vacume activation. ,  retrograde  Kinesiotex  Facilitate Muscle;Edema      Kinesiotix   Edema  knee    Facilitate Muscle   quads             PT Education - 12/27/18 1315    Education Details  self care    Person(s) Educated  Patient    Methods  Explanation;Demonstration    Comprehension  Verbalized understanding          PT Long Term Goals - 12/23/18 1225      PT LONG TERM GOAL #1   Title  Pt will be I and compliant with HEP. (6 weeks 02/03/19)    Status  New      PT LONG TERM GOAL #2   Title  Pt will improve FOTO to less than 30% limited. (6 weeks 02/03/19)    Status  New      PT LONG TERM GOAL #3   Title  Pt will improve Rt knee ROM and strength to WNL at least 135 deg knee flexion. (6 weeks 02/03/19)    Status  New      PT LONG TERM GOAL #4   Title  Pt will be able to peform usual activity, work activity, and return to gym activity of jogging and resistance training, hiking, with no more than 2/10 overall pain. (6 weeks 02/03/19)            Plan - 12/27/18 1317    Clinical Impression Statement  Patient subjective focused mainly on her edema.   She had given up on   things to help except rest.  Eucation and trial of taping  with manual techniques should help.  Girth  jointline  37 cm.      PT Next Visit Plan     assess tape,  check to see if she is using RICE.  review HEP, address lateral patella tracking and progress activity tolerance as able, she wants to get back to working out, jogging, squatting, stairs.    PT Home Exercise Plan  IT band stretch, quad stretch, heelslides, hip add sq, LAQ with ball sq, sit to stand with ball sq    Consulted and Agree with Plan of Care  Patient       Patient will benefit from skilled therapeutic intervention in order to improve the following deficits and impairments:     Visit Diagnosis: Acute pain of left knee  Stiffness of left knee, not elsewhere classified  Localized edema     Problem List Patient Active Problem List   Diagnosis Date Noted  . Chronic pain of right knee 11/22/2018  . 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 Lateral meniscus tear left 11/08/2018  . Migraine 07/06/2018  . Anxiety 07/06/2018    HARRIS,KAREN  PTA 12/27/2018, 1:21 PM  [facility] [address] [place], [place], [address] Phone: [phone]   Fax:  [phone]  Name: Rachel [name] MRN: [id number] Date of Birth: 1996/02/07

## 2019-01-02 MED FILL — ALYACEN 1-35-28 TABLET: 1-35 | 84 days supply | Qty: 84 | Fill #1

## 2019-01-04 ENCOUNTER — Other Ambulatory Visit: Payer: Self-pay

## 2019-01-04 ENCOUNTER — Ambulatory Visit: Payer: No Typology Code available for payment source | Admitting: Physical Therapy

## 2019-01-04 ENCOUNTER — Encounter: Payer: Self-pay | Admitting: Pulmonary Disease

## 2019-01-04 ENCOUNTER — Ambulatory Visit (INDEPENDENT_AMBULATORY_CARE_PROVIDER_SITE_OTHER): Payer: No Typology Code available for payment source | Admitting: Pulmonary Disease

## 2019-01-04 ENCOUNTER — Other Ambulatory Visit: Payer: Self-pay | Admitting: Family Medicine

## 2019-01-04 VITALS — BP 110/80 | HR 77 | Ht 62.0 in | Wt 146.0 lb

## 2019-01-04 DIAGNOSIS — R600 Localized edema: Secondary | ICD-10-CM

## 2019-01-04 DIAGNOSIS — R5383 Other fatigue: Secondary | ICD-10-CM | POA: Diagnosis not present

## 2019-01-04 DIAGNOSIS — G43109 Migraine with aura, not intractable, without status migrainosus: Secondary | ICD-10-CM

## 2019-01-04 DIAGNOSIS — G478 Other sleep disorders: Secondary | ICD-10-CM

## 2019-01-04 DIAGNOSIS — M25662 Stiffness of left knee, not elsewhere classified: Secondary | ICD-10-CM

## 2019-01-04 DIAGNOSIS — M25562 Pain in left knee: Secondary | ICD-10-CM

## 2019-01-04 MED ORDER — SERTRALINE HCL 50 MG PO TABS: ORAL_TABLET | ORAL | 1 refills | Status: DC

## 2019-01-04 MED FILL — SUMAtriptan SUCCINATE 100 M: 100 | 30 days supply | Qty: 9 | Fill #0

## 2019-01-04 MED FILL — SERTRALINE HCL 50 MG TABLET: 50 | 30 days supply | Qty: 30 | Fill #0

## 2019-01-04 NOTE — Therapy (Signed)
[place], [place], [address] Phone: [phone]   Fax:  [phone]  Physical Therapy Treatment  Patient Details  Name: Rachel [name] MRN: [id number] Date of Birth: 07/14/1996 Referring Provider (PT): Garald Balding, MD   Encounter Date: 01/04/2019  PT End of Session - 01/04/19 1101    Visit Number  3    Number of Visits  8    Date for PT Re-Evaluation  02/03/19    Authorization Type  [facility] focus plan    PT Start Time  0930    PT Stop Time  1015    PT Time Calculation (min)  45 min    Activity Tolerance  Patient tolerated treatment well       Past Medical History:  Diagnosis Date  . Asthma    as a child   . Headache     Past Surgical History:  Procedure Laterality Date  . KNEE ARTHROSCOPY Left 2013   meniscus repair  . KNEE ARTHROSCOPY WITH LATERAL MENISECTOMY Right 11/08/2018   Procedure: RIGHT KNEE ARTHROSCOPY WITH LATERAL MENISECTOMY, EXCISION MENISCAL CYST LATERAL;  Surgeon: Garald Balding, MD;  Location: [place];  Service: Orthopedics;  Laterality: Right;    There were no vitals filed for this visit.  Subjective Assessment - 01/04/19 1043    Subjective  She relays the knee is much better no pain upon arrival but still some pain at work sometimes    Pertinent History  PMH: Lt knee scope 2013, asthma    Limitations  House hold activities;Lifting;Standing;Walking    Currently in Pain?  No/denies      Objective:  recumbant bike intervals 30 sec fast, 30 sec easy X 6 min HSS, ITB stretch, and quad stretch 3  X30 sec ea bridge with ball squeeze 5 sec X 15 Bridge with alt knee ext and ball squeeze X15 sit to stand with ball sq and slow eccentric lower X 20 wall sits with ball sq 10 sec  x 10 SLS 3X30 sec then SL RDL X 10 lunges at counter with one UE support X 10 bilat lateral walking with blue band around knees Up/down counter  x3 lateral step up and retro step  up 6 inch  5 min manual therapy or knee mobs, and patella mobs She declined modalaties today as no pain   PT Education - 01/04/19 1040    Education Details  updated HEP          PT Long Term Goals - 12/23/18 1225      PT LONG TERM GOAL #1   Title  Pt will be I and compliant with HEP. (6 weeks 02/03/19)    Status  New      PT LONG TERM GOAL #2   Title  Pt will improve FOTO to less than 30% limited. (6 weeks 02/03/19)    Status  New      PT LONG TERM GOAL #3   Title  Pt will improve Rt knee ROM and strength to WNL at least 135 deg knee flexion. (6 weeks 02/03/19)    Status  New      PT LONG TERM GOAL #4   Title  Pt will be able to peform usual activity, work activity, and return to gym activity of jogging and resistance training, hiking, with no more than 2/10 overall pain. (6 weeks 02/03/19)            Plan - 01/04/19 1104  Clinical Impression Statement  Pt has made excellent progress and now only has mild intermittent pain and normal ROM. She does still lack minor stabilization and her HEP was progressed to add more of this in and advance her. She has 2 more visits set up and she may be ready for discharge next week if she continues to progress and if she has met her goals.     Rehab Potential  Excellent    PT Frequency  2x / week    PT Duration  6 weeks    PT Treatment/Interventions  Cryotherapy;Electrical Stimulation;Iontophoresis 26m/ml Dexamethasone;Moist Heat;Ultrasound;Stair training;Therapeutic activities;Therapeutic exercise;Neuromuscular re-education;Manual techniques;Passive range of motion;Taping;Joint Manipulations    PT Next Visit Plan  assess if she is ready for DC    PT Home Exercise Plan  IT band stretch, quad stretch, heelslides, hip add sq, LAQ with ball sq, sit to stand with ball sq, added wall sits and lunges, SLS, SL pick up, lat walking    Consulted and Agree with Plan of Care  Patient       Patient will benefit from skilled therapeutic  intervention in order to improve the following deficits and impairments:  Decreased activity tolerance, Decreased endurance, Decreased range of motion, Decreased strength, Hypomobility, Difficulty walking  Visit Diagnosis: Acute pain of left knee  Stiffness of left knee, not elsewhere classified  Localized edema     Problem List Patient Active Problem List   Diagnosis Date Noted  . Chronic pain of right knee 11/22/2018  . 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 Lateral meniscus tear left 11/08/2018  . Migraine 07/06/2018  . Anxiety 07/06/2018    BSilvestre Mesi3/08/2019, 11:07 AM  [facility] [address]Phone: [phone]  Fax:  [phone] Name: Rachel KriegelMRN: [id number] of Birth: 105/02/1996

## 2019-01-04 NOTE — Patient Instructions (Signed)
<HTML><META HTTP-EQUIV="content-type" CONTENT="text/html;charset=utf-8"><STRONG>             Access Code: [email or web address]       <BR>URL: [email or web address]       <BR><SPAN id="[email or web address]">Date: 01/04/2019</SPAN>                                                     <BR>Prepared by: Ivery Quale                             </STRONG>      <BR><BR><SPAN>Exercises</SPAN>      <UL class="program-note-list"><LI>Supine Bridge with Knee Extension and Pelvic Floor Contraction                 - 10 reps                 - 3 sets                         - 2x daily                         - 6x weekly                </LI><LI>Mini Lunge                 - 10 reps                 - 1-2 sets                         - 2x daily                         - 6x weekly                </LI><LI>Wall Squat with Swiss Ball and Ball Between Knees                 - 10 reps                 - 3 sets                         - 2x daily                         - 6x weekly                </LI><LI>Lateral Monster Walk with Squat and Resistance (BKA)                 - 10 reps                 - 3 sets                         - 2x daily                         - 6x weekly                </LI><LI>Backward Step Up                 -  10 reps                 - 3 sets                         - 2x daily                         - 6x weekly                </LI></UL>

## 2019-01-04 NOTE — Progress Notes (Signed)
Rachel [name]    [id number]    [birth year]  Primary Care Physician:Koberlein, Paris Lore, MD  Referring Physician: Wynn Banker, MD [address] [place], [place] [address]  Chief complaint:   Frequent nocturnal headaches Frequent awakenings Insomnia  HPI: Patient with frequent awakenings, insomnia which started over 10 years ago Progressively worse Very fatigued during the day from poor sleep  Occasional snoring No dryness of her mouth in the mornings  Usually goes to bed about 9-11 PM Takes up to an hour or more to fall asleep  Wakes up frequently and finds it difficult to go back to sleep She has lost and gained weight recently overall about 10 pound weight gain recently secondary to having knee surgery  Denies any significant dryness of her mouth in the mornings Occasional night sweats whenever she has a bad dream  No significant family history of sleep disordered breathing although admits to mother snoring   Outpatient Encounter Medications as of 01/04/2019  Medication Sig  . norethindrone-ethinyl estradiol 1/35 (NORTREL 1/35, 21,) tablet Take 1 tablet by mouth daily.  . propranolol ER (INDERAL LA) 80 MG 24 hr capsule Take 1 capsule (80 mg total) by mouth daily.  . SUMAtriptan (IMITREX) 100 MG tablet TAKE 1 TABLET BY MOUTH ONCE DAILY AS NEEDED FOR MIGRAINE, MAY REPEAT IN 2 DAYS IF HEADACHE PERSISTS OR RECURS NOT TO EXCEED 2 TABS IN 24 HOU  . [DISCONTINUED] cyclobenzaprine (FLEXERIL) 10 MG tablet Take 1 tablet (10 mg total) by mouth at bedtime as needed for muscle spasms.   No facility-administered encounter medications on file as of 01/04/2019.     Allergies as of 01/04/2019 - Review Complete 01/04/2019  Allergen Reaction Noted  . Sulfur Hives 07/06/2018    Past Medical History:  Diagnosis Date  . Asthma    as a child   . Headache     Past Surgical History:  Procedure Laterality Date  . KNEE ARTHROSCOPY Left 2013   meniscus repair   . KNEE ARTHROSCOPY WITH LATERAL MENISECTOMY Right 11/08/2018   Procedure: RIGHT KNEE ARTHROSCOPY WITH LATERAL MENISECTOMY, EXCISION MENISCAL CYST LATERAL;  Surgeon: [name], MD;  Location: [facility];  Service: Orthopedics;  Laterality: Right;    Family History  Problem Relation Age of Onset  . Bipolar disorder Mother   . Schizophrenia Mother        significant paranoia  . OCD Mother   . Migraines Mother   . Atrial fibrillation Father   . High Cholesterol Father   . Migraines Sister   . CAD Maternal Grandmother   . Vascular Disease Maternal Grandmother   . Mental illness Paternal Grandmother   . Migraines Sister   . Migraines Sister   . High Cholesterol Paternal Grandfather   . Heart disease Paternal Grandfather   . Migraines Paternal Grandfather     Social History   Socioeconomic History  . Marital status: Single    Spouse name: Not on file  . Number of children: Not on file  . Years of education: Not on file  . Highest education level: Not on file  Occupational History  . Not on file  Social Needs  . Financial resource strain: Not on file  . Food insecurity:    Worry: Not on file    Inability: Not on file  . Transportation needs:    Medical: Not on file    Non-medical: Not on file  Tobacco Use  .  Smoking status: Never Smoker  . Smokeless tobacco: Never Used  Substance and Sexual Activity  . Alcohol use: Yes    Comment: occasional  . Drug use: Never  . Sexual activity: Yes    Partners: Male  Lifestyle  . Physical activity:    Days per week: Not on file    Minutes per session: Not on file  . Stress: Not on file  Relationships  . Social connections:    Talks on phone: Not on file    Gets together: Not on file    Attends religious service: Not on file    Active member of club or organization: Not on file    Attends meetings of clubs or organizations: Not on file    Relationship status: Not on file  . Intimate partner violence:     Fear of current or ex partner: Not on file    Emotionally abused: Not on file    Physically abused: Not on file    Forced sexual activity: Not on file  Other Topics Concern  . Not on file  Social History Narrative  . Not on file    Review of Systems  Constitutional: Negative.   HENT: Negative.   Eyes: Negative.   Respiratory: Negative.  Negative for cough and shortness of breath.   Cardiovascular: Negative.   Gastrointestinal: Negative.   Psychiatric/Behavioral: Positive for sleep disturbance.  All other systems reviewed and are negative.   Vitals:   01/04/19 1532  BP: 110/80  Pulse: 77  SpO2: 99%     Physical Exam  Constitutional: She appears well-developed and well-nourished.  HENT:  Head: Normocephalic and atraumatic.  Crowded oropharynx, Mallampati 2  Eyes: Pupils are equal, round, and reactive to light. Conjunctivae and EOM are normal. Right eye exhibits no discharge. Left eye exhibits no discharge.  Neck: Normal range of motion. Neck supple. No tracheal deviation present. No thyromegaly present.  Cardiovascular: Normal rate and regular rhythm.  Pulmonary/Chest: Effort normal and breath sounds normal. No respiratory distress. She has no wheezes. She has no rales.  Abdominal: Soft. Bowel sounds are normal. She exhibits no distension. There is no abdominal tenderness. There is no rebound.   Results of the Epworth flowsheet 01/04/2019  Sitting and reading 1  Watching TV 0  Sitting, inactive in a public place (e.g. a theatre or a meeting) 0  As a passenger in a car for an hour without a break 0  Lying down to rest in the afternoon when circumstances permit 1  Sitting and talking to someone 0  Sitting quietly after a lunch without alcohol 0  In a car, while stopped for a few minutes in traffic 0  Total score 2    Assessment:  Nonrestorative sleep  Sleep onset and sleep maintenance insomnia  Frequent headaches  Daytime fatigue  Plan/Recommendations:  I  will start the patient on sertraline for anxiety  Obtain a home sleep study to rule out significant sleep disordered breathing -Nocturnal desaturations may contribute to nocturnal headaches-we will pay attention to this on the home sleep study  May require a hypnotic for insomnia  Encouraged to call with any significant concerns  Physiology of sleep disordered breathing discussed with patient as this may contribute to frequent awakenings   Virl Diamond MD [facility] 01/04/2019, 3:47 PM  CC: Wynn Banker, MD

## 2019-01-04 NOTE — Patient Instructions (Signed)
History of anxiety Nonrestorative sleep Frequent nocturnal headaches  We will start you on sertraline for anxiety, may help you sleep  We will obtain a home sleep study-there is a possibility of this contributing to frequent awakenings  We will see you back in the office in about 4 weeks

## 2019-01-11 ENCOUNTER — Other Ambulatory Visit: Payer: Self-pay

## 2019-01-11 ENCOUNTER — Ambulatory Visit: Payer: No Typology Code available for payment source | Admitting: Physical Therapy

## 2019-01-11 DIAGNOSIS — M25562 Pain in left knee: Secondary | ICD-10-CM

## 2019-01-11 DIAGNOSIS — M25662 Stiffness of left knee, not elsewhere classified: Secondary | ICD-10-CM

## 2019-01-11 MED FILL — PROPRANOLOL HCL ER 80 MG CP: 80 | 30 days supply | Qty: 30 | Fill #1

## 2019-01-11 NOTE — Therapy (Addendum)
[place], [place], [address] Phone: [phone]   Fax:  [phone]  Physical Therapy Treatment/Discharge Addendum  Patient Details  Name: Rachel [name] MRN: [id number] Date of Birth: June 30, 1996 Referring Provider (PT): Garald Balding, MD   Encounter Date: 01/11/2019  PT End of Session - 01/11/19 1042    Visit Number  4    Number of Visits  8    Date for PT Re-Evaluation  02/03/19    Authorization Type  [facility] focus plan    PT Start Time  0930    PT Stop Time  1015    PT Time Calculation (min)  45 min    Activity Tolerance  Patient tolerated treatment well    Behavior During Therapy  [facility] for tasks assessed/performed       Past Medical History:  Diagnosis Date  . Asthma    as a child   . Headache     Past Surgical History:  Procedure Laterality Date  . KNEE ARTHROSCOPY Left 2013   meniscus repair  . KNEE ARTHROSCOPY WITH LATERAL MENISECTOMY Right 11/08/2018   Procedure: RIGHT KNEE ARTHROSCOPY WITH LATERAL MENISECTOMY, EXCISION MENISCAL CYST LATERAL;  Surgeon: Garald Balding, MD;  Location: [place];  Service: Orthopedics;  Laterality: Right;    There were no vitals filed for this visit.  Subjective Assessment - 01/11/19 1038    Subjective  Pt relays her knee is doing good no pain unless she kneels down on her knee    Pertinent History  PMH: Lt knee scope 2013, asthma    Patient Stated Goals  get the pain down and bend my knee to squat without pain.    Currently in Pain?  No/denies      Treatment today:  Walk jog intervals 30 sec jog 4.5 mph, 30 sec walk 3 mph for 8 min total  Stair climbing 5 min hill program L4 to simulate hiking as this was her goal  IT band stretch and quad stretch 30 sec 2 each, then kneeling quad stretch 20 sec X 3  Kneeling down on foam pad 2 min with wt shifting  SL sit to stand X 10, SL RDL reaching to floor X 10  Hopping X 10 lateral  from leg to leg then fwd from leg to leg, then Rt unilat   Objective measurements today:  AROM: bilat knee flexion 130 deg, bilat knee ext WNL, Strength: bilat hip and knee strength 5/5 MMT   Funcitonal leg reach strenth test 17 7/8 inch on rt, 17 inch on Lt so Rt leg is now stronger than Lt     PT Education - 01/11/19 1041    Education Details  PT feels she is ready for discharge, discussed progress and updated HEP    Person(s) Educated  Patient    Methods  Explanation    Comprehension  Verbalized understanding          PT Long Term Goals - 01/11/19 0956      PT LONG TERM GOAL #1   Title  Pt will be I and compliant with HEP. (6 weeks 02/03/19)    Status  Achieved      PT LONG TERM GOAL #2   Title  Pt will improve FOTO to less than 30% limited. (6 weeks 02/03/19)    Status  Achieved      PT LONG TERM GOAL #3   Title  Pt will improve Rt knee ROM and  strength to WNL at least 135 deg knee flexion. (6 weeks 02/03/19)    Status  Achieved      PT LONG TERM GOAL #4   Title  Pt will be able to peform usual activity, work activity, and return to gym activity of jogging and resistance training, hiking, with no more than 2/10 overall pain. (6 weeks 02/03/19)    Baseline  unable to test hiking but did have her do stair climber for 5 min and jog today without any pain.    Status  Partially Met            Plan - 01/11/19 1042    Clinical Impression Statement  Pt has now met all goals and PT feels she is ready for discharge. She does still have some pain with kneeling so she was shown how to begin kneeling on foam pad and then progress to pillow, then to rolled up towel, then carpet, and finally hardwood floor. She understood this progress as well as joging progression and further exercise progression. She has now met all PT goals and will be discharged. She wishes to keep her case open for 30 days in case she has a set back. If PT does not hear from her in 30 days her case will be  closed out then.     Rehab Potential  Excellent    PT Frequency  2x / week    PT Duration  6 weeks    PT Next Visit Plan  discharge to HEP with 30 day hold    Consulted and Agree with Plan of Care  Patient       Patient will benefit from skilled therapeutic intervention in order to improve the following deficits and impairments:  Decreased activity tolerance, Decreased endurance, Decreased range of motion, Decreased strength, Hypomobility, Difficulty walking  Visit Diagnosis: Acute pain of left knee  Stiffness of left knee, not elsewhere classified     Problem List Patient Active Problem List   Diagnosis Date Noted  . Chronic pain of right knee 11/22/2018  . 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 Lateral meniscus tear left 11/08/2018  . Migraine 07/06/2018  . Anxiety 07/06/2018    Silvestre Mesi 01/11/2019, 10:46 AM  PHYSICAL THERAPY DISCHARGE SUMMARY  Visits from Start of Care: 4  Current functional level related to goals / functional outcomes: Back to baseline   Remaining deficits: Mild knee tightness with kneeling   Education / Equipment: HEP Plan: Patient agrees to discharge.  Patient goals were met. Patient is being discharged due to meeting the stated rehab goals.  ?????    Pt called our office to relay she is back to baseline and feels ready to discharge and PT agrees  Elsie Ra, PT, DPT 01/13/19 8:42 AM   [place], [place], [address] Phone: [phone]   Fax:  [phone]  Name: Mckay Brandt MRN: [id number] Date of Birth: 1996/07/27

## 2019-01-18 ENCOUNTER — Ambulatory Visit: Payer: No Typology Code available for payment source | Admitting: Physical Therapy

## 2019-01-19 ENCOUNTER — Other Ambulatory Visit: Payer: Self-pay

## 2019-01-19 ENCOUNTER — Ambulatory Visit (INDEPENDENT_AMBULATORY_CARE_PROVIDER_SITE_OTHER): Payer: No Typology Code available for payment source | Admitting: Orthopaedic Surgery

## 2019-01-19 ENCOUNTER — Encounter (INDEPENDENT_AMBULATORY_CARE_PROVIDER_SITE_OTHER): Payer: Self-pay | Admitting: Orthopaedic Surgery

## 2019-01-19 VITALS — BP 112/80 | HR 72 | Ht 62.0 in | Wt 144.0 lb

## 2019-01-19 DIAGNOSIS — G8929 Other chronic pain: Secondary | ICD-10-CM

## 2019-01-19 DIAGNOSIS — M25561 Pain in right knee: Secondary | ICD-10-CM

## 2019-01-19 NOTE — Progress Notes (Signed)
Office Visit Note   Patient: Rachel [name]           Date of Birth: [birth year]           MRN: [id number] Visit Date: 01/19/2019              Requested by: Wynn Banker, MD [address] [place], [place] [address] PCP: Wynn Banker, MD   Assessment & Plan: Visit Diagnoses:  1. Chronic pain of right knee     Plan: Approaching 3 months status post right knee arthroscopy with a tear of a discoid lateral meniscus associated with a lateral meniscus cyst.  Completed a course of physical therapy.  Presently very happy with the results.  Able to jump and run and twist right knee without any significant problems.  Will urged her to continue with her exercises and return as needed.  Follow-Up Instructions: Return if symptoms worsen or fail to improve.   Orders:  No orders of the defined types were placed in this encounter.  No orders of the defined types were placed in this encounter.     Procedures: No procedures performed   Clinical Data: No additional findings.   Subjective: Chief Complaint  Patient presents with   Right Knee - Follow-up    Right knee scope DOS 11/08/18  Patient presents today for follow up on her right knee. She had a right knee arthroscopy with lateral meniscectomy on 11/08/18. She is now 10weeks out from surgery. She received a cortisone injection at her last visit on 12/22/18. She said that she is almost 100% back to normal. She finished therapy last visit.   HPI  Review of Systems   Objective: Vital Signs: BP 112/80    Pulse 72    Ht 5\' 2"  (1.575 m)    Wt 144 lb (65.3 kg)    LMP 12/29/2018    BMI 26.34 kg/m   Physical Exam  Ortho Exam right knee was not hot red warm and swollen.  No effusion.  Arthroscopic portals of healed without problem.  No evidence of a lateral meniscal cyst.  Minimal lateral joint pain.  None medially and none beneath the patella.  Full extension and flexion comparable to the left knee.  Does not walk with a  limp.  No distal edema.  Neurologically intact  Specialty Comments:  No specialty comments available.  Imaging: No results found.   PMFS History: Patient Active Problem List   Diagnosis Date Noted   Chronic pain of right knee 11/22/2018   Discoid lateral meniscus of left knee 11/08/2018   Lateral meniscus tear left 11/08/2018   Migraine 07/06/2018   Anxiety 07/06/2018   Past Medical History:  Diagnosis Date   Asthma    as a child    Headache     Family History  Problem Relation Age of Onset   Bipolar disorder Mother    Schizophrenia Mother        significant paranoia   OCD Mother    Migraines Mother    Atrial fibrillation Father    High Cholesterol Father    Migraines Sister    CAD Maternal Grandmother    Vascular Disease Maternal Grandmother    Mental illness Paternal Grandmother    Migraines Sister    Migraines Sister    High Cholesterol Paternal Grandfather    Heart disease Paternal Grandfather    Migraines Paternal Grandfather     Past Surgical History:  Procedure Laterality Date   KNEE  ARTHROSCOPY Left 2013   meniscus repair   KNEE ARTHROSCOPY WITH LATERAL MENISECTOMY Right 11/08/2018   Procedure: RIGHT KNEE ARTHROSCOPY WITH LATERAL MENISECTOMY, EXCISION MENISCAL CYST LATERAL;  Surgeon: [name], MD;  Location: [facility];  Service: Orthopedics;  Laterality: Right;   Social History   Occupational History   Not on file  Tobacco Use   Smoking status: Never Smoker   Smokeless tobacco: Never Used  Substance and Sexual Activity   Alcohol use: Yes    Comment: occasional   Drug use: Never   Sexual activity: Yes    Partners: Male

## 2019-01-28 MED FILL — SUMATRIPTAN SUCC 100 MG TAB: 100 | 30 days supply | Qty: 9 | Fill #1

## 2019-01-28 MED FILL — PROPRANOLOL HCL ER 80 MG CP: 80 | 30 days supply | Qty: 30 | Fill #2

## 2019-01-28 MED FILL — ALYACEN 1-35-28 TABLET: 1-35 | 84 days supply | Qty: 84 | Fill #2

## 2019-01-28 MED FILL — SERTRALINE HCL 50 MG TABLET: 50 | 30 days supply | Qty: 30 | Fill #1

## 2019-02-14 ENCOUNTER — Ambulatory Visit: Payer: No Typology Code available for payment source | Admitting: Neurology

## 2019-02-14 ENCOUNTER — Ambulatory Visit: Payer: No Typology Code available for payment source | Admitting: Pulmonary Disease

## 2019-02-28 ENCOUNTER — Other Ambulatory Visit: Payer: Self-pay | Admitting: Pulmonary Disease

## 2019-02-28 ENCOUNTER — Other Ambulatory Visit: Payer: Self-pay | Admitting: Family Medicine

## 2019-02-28 ENCOUNTER — Other Ambulatory Visit: Payer: Self-pay

## 2019-02-28 ENCOUNTER — Ambulatory Visit: Payer: No Typology Code available for payment source

## 2019-02-28 DIAGNOSIS — G43809 Other migraine, not intractable, without status migrainosus: Secondary | ICD-10-CM

## 2019-02-28 DIAGNOSIS — G478 Other sleep disorders: Secondary | ICD-10-CM

## 2019-02-28 DIAGNOSIS — G4733 Obstructive sleep apnea (adult) (pediatric): Secondary | ICD-10-CM | POA: Diagnosis not present

## 2019-02-28 MED FILL — SERTRALINE HCL 50 MG TABLET: 50 | 30 days supply | Qty: 30 | Fill #0

## 2019-03-01 DIAGNOSIS — G4733 Obstructive sleep apnea (adult) (pediatric): Secondary | ICD-10-CM

## 2019-03-02 ENCOUNTER — Other Ambulatory Visit: Payer: Self-pay | Admitting: *Deleted

## 2019-03-02 DIAGNOSIS — G43809 Other migraine, not intractable, without status migrainosus: Secondary | ICD-10-CM

## 2019-03-02 MED ORDER — PROPRANOLOL HCL ER 80 MG PO CP24: 80.0000 mg | ORAL_CAPSULE | Freq: Every day | ORAL | 2 refills | Status: DC

## 2019-03-02 MED FILL — PROPRANOLOL HCL ER 80 MG CP: 80 | 30 days supply | Qty: 30 | Fill #0

## 2019-03-02 NOTE — Telephone Encounter (Signed)
Rx done. 

## 2019-03-08 ENCOUNTER — Other Ambulatory Visit: Payer: Self-pay

## 2019-03-08 ENCOUNTER — Ambulatory Visit (INDEPENDENT_AMBULATORY_CARE_PROVIDER_SITE_OTHER): Payer: No Typology Code available for payment source | Admitting: Family Medicine

## 2019-03-08 ENCOUNTER — Encounter: Payer: Self-pay | Admitting: Family Medicine

## 2019-03-08 DIAGNOSIS — F419 Anxiety disorder, unspecified: Secondary | ICD-10-CM | POA: Diagnosis not present

## 2019-03-08 DIAGNOSIS — G43109 Migraine with aura, not intractable, without status migrainosus: Secondary | ICD-10-CM

## 2019-03-08 MED ORDER — VENLAFAXINE HCL ER 75 MG PO CP24: 75.0000 mg | ORAL_CAPSULE | Freq: Every day | ORAL | 2 refills | Status: DC

## 2019-03-08 MED FILL — VENLAFAXINE HCL ER 75 MG CA: 75 | 30 days supply | Qty: 30 | Fill #0

## 2019-03-08 NOTE — Progress Notes (Signed)
Virtual Visit via Video Note  I connected with Rachel [name]  on 03/08/19 at 10:00 AM EDT by a video enabled telemedicine application and verified that I am speaking with the correct person using two identifiers.  Location patient: home Location provider:work or home office Persons participating in the virtual visit: patient, provider  I discussed the limitations of evaluation and management by telemedicine and the availability of in person appointments. The patient expressed understanding and agreed to proceed.   Rachel Sofiaatasha Mattson DOB: 12/11/1995 Encounter date: 03/08/2019  This is a 23 y.o. female who presents with Chief Complaint  Patient presents with  . Follow-up    History of present illness: Last visit was 11/2018 for migraine followup, sleep issues. Referred to neurology for evaluation of migraines. Referred to sleep specialist for sleep eval.  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 We also went back to BID propranolol instead of long acting.  Has been following with ortho since arthroscopy; had some concerns with dealyed improvement, but last note from 3/26 states that she was doing much better overall. She has done better since last injection. She is working on figuring out what she can and can't do.   Had neuro appointment scheduled in April - but ended up having to cancel due to hospital work. She wanted to have in person visit if possible. Migraines were getting better, but then this week has had migraine past 4 days. Prior to that hadn't had headache in over a week. Seems to do better with taking the continuous OCP.   Did sleep study last week. Did this at home. Was given klonopin to help with sleep. Since then she is sleeping better, through night, not waking with headaches. This has helped.     Anxiety is a little worse through day with COVID. Does feel she over-worries about every little thing. She is studying  and in online school. Worried about completing homework/staying on top of this. Feels that mood can fluctuate quite a bit. Does have panic attacks - feels like she holds breath to compress mood swing of crying. These happen randomly. Not sure what triggers these. Usually able to calm down after 10-15 minutes, but sometimes will take longer, esp if someone asks if she is ok. Denies any sad mood - feels that sometimes based on daily stressors.   Not waking up as tired since stopping flexeril. Gets up 6-7am at latest. In better routine for sleep overall.   Graduating in fall from online school. Has boards for [place] next week. [facility] [employer] to get bachelors in business and Social workerlaw. Would like to do supervising/management. She is now completing [employer].   Allergies  Allergen Reactions  . Sulfur Hives   Current Meds  Medication Sig  . norethindrone-ethinyl estradiol 1/35 (NORTREL 1/35, 21,) tablet Take 1 tablet by mouth daily.  . propranolol ER (INDERAL LA) 80 MG 24 hr capsule Take 1 capsule (80 mg total) by mouth daily.  . SUMAtriptan (IMITREX) 100 MG tablet TAKE 1 TABLET BY MOUTH ONCE DAILY AS NEEDED FOR MIGRAINE, MAY REPEAT IN 2 DAYS IF HEADACHE PERSISTS OR RECURS NOT TO EXCEED 2 TABS IN 24 HOU  . [DISCONTINUED] sertraline (ZOLOFT) 50 MG tablet TAKE 1/2 TABLET BY MOUTH NIGHTLY FOR 1 WEEK THEN INCREASE TO 1 TABLET NIGHTLY (Patient taking differently: Take 50 mg by mouth at bedtime. TAKE 1/2 TABLET BY MOUTH NIGHTLY FOR 1 WEEK THEN INCREASE TO  1 TABLET NIGHTLY)    Review of Systems  Constitutional: Negative for chills, fatigue and fever.  Respiratory: Negative for cough, chest tightness, shortness of breath and wheezing.   Cardiovascular: Negative for chest pain, palpitations and leg swelling.  Musculoskeletal: Positive for arthralgias (knee pain is improving; still flares on occasion).  Neurological: Positive for headaches.  Psychiatric/Behavioral: Negative for agitation and suicidal  ideas. Sleep disturbance: improved; see above. The patient is nervous/anxious.     Objective:  There were no vitals taken for this visit.      BP Readings from Last 3 Encounters:  01/19/19 112/80  01/04/19 110/80  12/22/18 107/67   Wt Readings from Last 3 Encounters:  01/19/19 144 lb (65.3 kg)  01/04/19 146 lb (66.2 kg)  12/22/18 140 lb (63.5 kg)    EXAM:  GENERAL: alert, oriented, appears well and in no acute distress  HEENT: atraumatic, conjunctiva clear, no obvious abnormalities on inspection of external nose and ears  NECK: normal movements of the head and neck  LUNGS: on inspection no signs of respiratory distress, breathing rate appears normal, no obvious gross SOB, gasping or wheezing  CV: no obvious cyanosis  MS: moves all visible extremities without noticeable abnormality  PSYCH/NEURO: pleasant and cooperative, no obvious depression. She does worry excessively through day with frequent panic attacks. Sometimes difficulty recovering and calming down from these. speech and thought processing grossly intact   Assessment/Plan 1. Anxiety We discussed change of zoloft to effexor. This should help with better anxiety/panic attack control as well as possibly helping with migraines. Discussed new medication(s) today with patient. Discussed potential side effects and patient verbalized understanding.  Follow up in 1 month for recheck of mood/med check in. She will let me know sooner if any problems with medication.   2. Migraine with aura and without status migrainosus, not intractable Improved since sleep has improved with klonopin. Neuro appointment pending. We are going to try effexor and see if this further helps decrease frequency while addressing anxiety.   Return in about 1 month (around 04/08/2019) for Chronic condition visit.   I discussed the assessment and treatment plan with the patient. The patient was provided an opportunity to ask questions and all were  answered. The patient agreed with the plan and demonstrated an understanding of the instructions.   The patient was advised to call back or seek an in-person evaluation if the symptoms worsen or if the condition fails to improve as anticipated.  I provided 25 minutes of non-face-to-face time during this encounter.   Theodis Shove, MD

## 2019-03-09 ENCOUNTER — Other Ambulatory Visit: Payer: Self-pay | Admitting: Pulmonary Disease

## 2019-03-11 ENCOUNTER — Other Ambulatory Visit: Payer: Self-pay | Admitting: Pulmonary Disease

## 2019-03-14 ENCOUNTER — Encounter: Payer: Self-pay | Admitting: Family Medicine

## 2019-03-24 MED FILL — SERTRALINE HCL 50 MG TABS: 50 | 30 days supply | Qty: 30 | Fill #0

## 2019-03-27 ENCOUNTER — Telehealth: Payer: Self-pay | Admitting: Pulmonary Disease

## 2019-03-27 ENCOUNTER — Telehealth: Payer: Self-pay | Admitting: Family Medicine

## 2019-03-27 ENCOUNTER — Encounter: Payer: Self-pay | Admitting: *Deleted

## 2019-03-27 NOTE — Telephone Encounter (Signed)
Copied from CRM [id number]. Topic: Referral - Status >> Mar 27, 2019  8:59 AM Deborha Payment wrote: Reason for CRM: Patient needs a new referral for [facility] [place], patient did have referral from PCP in feb but Neurology could not see her until April and when she did end up making the appt she had to cancel and now the referral is canceled. So Patient is requesting new referral.   Patient call back #[phone] Or message patient on mychart,

## 2019-03-27 NOTE — Telephone Encounter (Signed)
Called and left voicemail results of HST negative for OSA. Also sent detailed.mychart message. Nothing further needed.

## 2019-03-28 ENCOUNTER — Encounter: Payer: Self-pay | Admitting: *Deleted

## 2019-03-28 NOTE — Telephone Encounter (Signed)
See Mychart message that was sent to the pt.

## 2019-03-29 ENCOUNTER — Encounter: Payer: Self-pay | Admitting: Family Medicine

## 2019-03-31 ENCOUNTER — Telehealth: Payer: Self-pay | Admitting: *Deleted

## 2019-03-31 NOTE — Telephone Encounter (Signed)
I left a detailed message for the pt to call back for an appt as below.   

## 2019-03-31 NOTE — Telephone Encounter (Signed)
-----   Message from Wynn Banker, MD sent at 03/31/2019  2:34 PM EDT ----- Please schedule virtual visit for her to discuss headaches/anxiety

## 2019-04-03 NOTE — Telephone Encounter (Signed)
I sent one message back to her already. I see she is on schedule 6/12 but we need to talk with her sooner if possible. Sounds like she is miserable with headaches. If she can't see me; then perhaps another provider.   Happy to see her in office if she wants or doxy.   Please make sure she got in touch w neuro (I have referred her to specialist for headaches but she has had hard time reaching them).

## 2019-04-04 NOTE — Telephone Encounter (Signed)
I called the pt and she stated she received the Mychart message response from Dr Ethlyn Gallery from 6/8.  Patient has an appt scheduled on 6/12 and message sent to Dr Ethlyn Gallery as Juluis Rainier.

## 2019-04-07 ENCOUNTER — Ambulatory Visit (INDEPENDENT_AMBULATORY_CARE_PROVIDER_SITE_OTHER): Payer: No Typology Code available for payment source | Admitting: Family Medicine

## 2019-04-07 ENCOUNTER — Encounter: Payer: Self-pay | Admitting: Family Medicine

## 2019-04-07 ENCOUNTER — Other Ambulatory Visit: Payer: Self-pay

## 2019-04-07 DIAGNOSIS — G43109 Migraine with aura, not intractable, without status migrainosus: Secondary | ICD-10-CM | POA: Diagnosis not present

## 2019-04-07 DIAGNOSIS — F419 Anxiety disorder, unspecified: Secondary | ICD-10-CM

## 2019-04-07 NOTE — Progress Notes (Signed)
Virtual Visit via Video Note  I connected with Rachel [name]  on 04/07/19 at  3:15 PM EDT by a video enabled telemedicine application and verified that I am speaking with the correct person using two identifiers.  Location patient: home Location provider:work or home office Persons participating in the virtual visit: patient, provider  I discussed the limitations of evaluation and management by telemedicine and the availability of in person appointments. The patient expressed understanding and agreed to proceed.   Rachel Sofiaatasha Corle DOB: 06/19/1996 Encounter date: 04/07/2019  This is a 23 y.o. female who presents with Chief Complaint  Patient presents with  . Follow-up    History of present illness: Increased headaches with effexor (sent message through mychart); recommendation was to switch back to zoloft. Appt with headache clinic not until next month.   Migraines got really bad over last weekend. This was when she was still taking effexor. Just kept getting worse. Was making her dizzy. Headache for 2-3 weeks straight but then just kept happening. Night sweats continued. Night sweats better with switch to zoloft and headache went away by day 2. Nothing compared to what it was before. Did not that effexor was helpful with anxiety - kept her from nervous scratching (will scratch/itch self until bleeding). Taking 50mg  at night with propranolol. In morning takes ocp.    Feels like anxiety is about the same; maybe a little better.  Felt like she wasn't thinking about worst case scenario all the time with medication. She feels she isn't tolerating the add on studies/work stress. In general though she is doing well with school; graduates in Dec and working on bachelors degree. Passed [place] clinicals.   Wakes with headaches frequently still. Stress is definitely related to headaches.      Allergies  Allergen Reactions  . Sulfur Hives  . Venlafaxine     Migraine, hot flashes   Current Meds   Medication Sig  . norethindrone-ethinyl estradiol 1/35 (NORTREL 1/35, 21,) tablet Take 1 tablet by mouth daily.  . propranolol ER (INDERAL LA) 80 MG 24 hr capsule Take 1 capsule (80 mg total) by mouth daily.  . sertraline (ZOLOFT) 50 MG tablet TAKE 1/2 TABLET BY MOUTH NIGHTLY FOR 1 WEEK THEN INCREASE TO 1 TABLET NIGHTLY  . SUMAtriptan (IMITREX) 100 MG tablet TAKE 1 TABLET BY MOUTH ONCE DAILY AS NEEDED FOR MIGRAINE, MAY REPEAT IN 2 DAYS IF HEADACHE PERSISTS OR RECURS NOT TO EXCEED 2 TABS IN 24 HOU  . [DISCONTINUED] venlafaxine XR (EFFEXOR-XR) 75 MG 24 hr capsule Take 1 capsule (75 mg total) by mouth at bedtime.    Review of Systems  Constitutional: Negative for chills, fatigue and fever.  Respiratory: Negative for cough, chest tightness, shortness of breath and wheezing.   Cardiovascular: Negative for chest pain, palpitations and leg swelling.  Psychiatric/Behavioral: Positive for sleep disturbance. Negative for agitation. The patient is nervous/anxious.     Objective:  There were no vitals taken for this visit.      BP Readings from Last 3 Encounters:  01/19/19 112/80  01/04/19 110/80  12/22/18 107/67   Wt Readings from Last 3 Encounters:  01/19/19 144 lb (65.3 kg)  01/04/19 146 lb (66.2 kg)  12/22/18 140 lb (63.5 kg)    EXAM:  GENERAL: alert, oriented, appears well and in no acute distress  HEENT: atraumatic, conjunctiva clear, no obvious abnormalities on inspection of external nose and ears  NECK: normal movements of the head and neck  LUNGS: on inspection no signs of  respiratory distress, breathing rate appears normal, no obvious gross SOB, gasping or wheezing  CV: no obvious cyanosis  MS: moves all visible extremities without noticeable abnormality  PSYCH/NEURO: pleasant and cooperative, no obvious depression or anxiety, speech and thought processing grossly intact   Assessment/Plan 1. Migraine with aura and without status migrainosus, not intractable Headaches  are essentially resolved with stopping the Effexor.  She is now just on the Zoloft.  She does have a follow-up with the headache clinic next month.  Me know if any worsening in the meanwhile.  2. Anxiety Currently on 50 mg of Zoloft.  I have asked her to try to increase this.  Can start with just 75 mg, but gave her the freedom to increase up to 100 mg as tolerated.  We discussed that sometimes it takes a slightly higher dose to help with anxiety.  No if any problems with this medication.  She will update me by my chart in 1 month's time, just prior to need for refill so that we can get her established on most effective dose.  Return mychart update 1 mo; follow up pending this response.   I discussed the assessment and treatment plan with the patient. The patient was provided an opportunity to ask questions and all were answered. The patient agreed with the plan and demonstrated an understanding of the instructions.   The patient was advised to call back or seek an in-person evaluation if the symptoms worsen or if the condition fails to improve as anticipated.  I provided 20 minutes of non-face-to-face time during this encounter.   Micheline Rough, MD

## 2019-04-11 MED FILL — PROPRANOLOL HCL ER 80 MG CP: 80 | 30 days supply | Qty: 30 | Fill #1

## 2019-04-12 ENCOUNTER — Ambulatory Visit: Payer: No Typology Code available for payment source | Admitting: Family Medicine

## 2019-04-22 MED FILL — ALYACEN 1-35-28 TABLET: 1-35 | 84 days supply | Qty: 84 | Fill #3

## 2019-04-24 ENCOUNTER — Encounter: Payer: Self-pay | Admitting: Family Medicine

## 2019-05-08 ENCOUNTER — Telehealth: Payer: No Typology Code available for payment source | Admitting: Neurology

## 2019-05-08 ENCOUNTER — Encounter: Payer: Self-pay | Admitting: Family Medicine

## 2019-05-08 ENCOUNTER — Other Ambulatory Visit: Payer: Self-pay | Admitting: Family Medicine

## 2019-05-08 MED ORDER — SERTRALINE HCL 50 MG PO TABS: 75.0000 mg | ORAL_TABLET | Freq: Every day | ORAL | 1 refills | Status: DC

## 2019-05-09 MED FILL — SERTRALINE HCL 50 MG TABLET: 50 | 30 days supply | Qty: 30 | Fill #1

## 2019-05-15 MED FILL — SF 5000 PLUS CREAM: 1.1 | 30 days supply | Qty: 51 | Fill #0

## 2019-05-21 NOTE — Progress Notes (Addendum)
Virtual Visit via Video Note The purpose of this virtual visit is to provide medical care while limiting exposure to the novel coronavirus.    Consent was obtained for video visit:  Yes Answered questions that patient had about telehealth interaction:  Yes I discussed the limitations, risks, security and privacy concerns of performing an evaluation and management service by telemedicine. I also discussed with the patient that there may be a patient responsible charge related to this service. The patient expressed understanding and agreed to proceed.  Pt location: Home Physician Location: Home Name of referring provider:  Caren Macadam, MD I connected with Rachel [name] at patients initiation/request on 05/22/2019 at  8:30 AM EDT by video enabled telemedicine application and verified that I am speaking with the correct person using two identifiers. Pt MRN:  [id number] Pt DOB:  August 31, 1996 Video Participants:  Rachel [name]   History of Present Illness:  Rachel [name] is a 24 year old female who presents for migraines.  History supplemented by referring provider notes.  Onset:  Since childhood Location:  Bifrontal/temporal.  If sleeping, it occurs on side she is sleeping on. Quality:  Headaches are pressure/throbbing headache, pounding if severe migraine. Intensity:  Headaches are 2-5/10; migraines 8-10/10.  She denies new headache, thunderclap headache or severe headache that wakes her from sleep. Aura:  no Associated symptoms: Photophobia, nausea, motion sickness, vomiting, phonophobia, osmophobia, blurred vision/black spots.  She denies associated unilateral numbness or weakness. Duration:  1 to 2 days Frequency:  Near daily headache days Frequency of abortive medication: takes analgesics/NSAIDs daily Triggers:  Chocolate (aggravates), smells of essential oils, inactivity Relieving factors:  Sleep in dark and quiet room, pressure on head Activity:  aggravates  She wakes up  every morning with headache and will progress to migraine.  Longest she had a migraine was 6 weeks.  MRI of brain without contrast from 12/15/18 personally reviewed and was normal.  Rescue therapy:  Starts with 2 ibuprofen and 3-4 hours later takes Excedrin Migraine and limits sumatriptan due to side effects. Current NSAIDS:  ibuprofen Current analgesics:  Excedrin Extra-Strength Current triptans:  Sumatriptan 100mg  (causes throat tightness and nausea) Current ergotamine:  none Current anti-emetic:  none Current muscle relaxants:  none Current anti-anxiolytic:  none Current sleep aide:  none  Current Antihypertensive medications:  none Current Antidepressant medications:  Sertraline 75mg  Current Anticonvulsant medications:  none Current anti-CGRP:  none Current Vitamins/Herbal/Supplements:  none Current Antihistamines/Decongestants:  none Other therapy:  none Hormone/birth control:  Nortrel 1/35  Past NSAIDS:  Aleve Past analgesics:  Excedrin Migraine, Tylenol Past abortive triptans:  Rizatriptan Past abortive ergotamine:  none Past muscle relaxants:  Flexeril Past anti-emetic:  none Past antihypertensive medications:  Propranolol ER 80mg  (lightheadedness) Past antidepressant medications:  Venlafaxine XR 75mg  (increased headaches), Cymbalta Past anticonvulsant medications:  Topiramate 75mg  to 100mg   Past anti-CGRP:  none Past vitamins/Herbal/Supplements:  riboflavin Past antihistamines/decongestants:  none Other past therapies:  None, Toradol shot (made headache worse)  Caffeine:  1 cup of coffee daily.  1 or 2 cans of cola a week.  Sometimes tea Diet:  64 oz water daily.  Trying whole plant-based diet (notes improvement).  Limits processed foods.  Red meat, chicken or fish maybe once a week. Exercise:  Trying to, needs to accommodate for torn meniscus Depression:  yes; Anxiety:  Yes (school and work) Other pain:  Knee pain Sleep hygiene:  Poor  Wakes up often with headache  Family history of headache:  Dad, sisters, mom,  maternal aunt, maternal grandmother all had migraines.  Her mom had a recurrent brain tumor.  Her aunt had colloid cyst.    Past Medical History: Past Medical History:  Diagnosis Date  . Asthma    as a child   . Headache     Medications: Outpatient Encounter Medications as of 05/22/2019  Medication Sig  . norethindrone-ethinyl estradiol 1/35 (NORTREL 1/35, 21,) tablet Take 1 tablet by mouth daily.  . sertraline (ZOLOFT) 50 MG tablet Take 1.5 tablets (75 mg total) by mouth daily.  . SUMAtriptan (IMITREX) 100 MG tablet TAKE 1 TABLET BY MOUTH ONCE DAILY AS NEEDED FOR MIGRAINE, MAY REPEAT IN 2 DAYS IF HEADACHE PERSISTS OR RECURS NOT TO EXCEED 2 TABS IN 24 HOU   No facility-administered encounter medications on file as of 05/22/2019.     Allergies: Allergies  Allergen Reactions  . Sulfur Hives  . Venlafaxine     Migraine, hot flashes    Family History: Family History  Problem Relation Age of Onset  . Bipolar disorder Mother   . Schizophrenia Mother        significant paranoia  . OCD Mother   . Migraines Mother   . Atrial fibrillation Father   . High Cholesterol Father   . Migraines Sister   . CAD Maternal Grandmother   . Vascular Disease Maternal Grandmother   . Mental illness Paternal Grandmother   . Migraines Sister   . Migraines Sister   . High Cholesterol Paternal Grandfather   . Heart disease Paternal Grandfather   . Migraines Paternal Grandfather     Social History: Social History   Socioeconomic History  . Marital status: Single    Spouse name: Not on file  . Number of children: Not on file  . Years of education: Not on file  . Highest education level: Not on file  Occupational History  . Not on file  Social Needs  . Financial resource strain: Not on file  . Food insecurity    Worry: Not on file    Inability: Not on file  . Transportation needs    Medical: Not on file    Non-medical: Not on file   Tobacco Use  . Smoking status: Never Smoker  . Smokeless tobacco: Never Used  Substance and Sexual Activity  . Alcohol use: Yes    Comment: occasional  . Drug use: Never  . Sexual activity: Yes    Partners: Male  Lifestyle  . Physical activity    Days per week: Not on file    Minutes per session: Not on file  . Stress: Not on file  Relationships  . Social Musicianconnections    Talks on phone: Not on file    Gets together: Not on file    Attends religious service: Not on file    Active member of club or organization: Not on file    Attends meetings of clubs or organizations: Not on file    Relationship status: Not on file  . Intimate partner violence    Fear of current or ex partner: Not on file    Emotionally abused: Not on file    Physically abused: Not on file    Forced sexual activity: Not on file  Other Topics Concern  . Not on file  Social History Narrative  . Not on file    Observations/Objective:   Height 5' 2.5" (1.588 m), weight 148 lb (67.1 kg). No acute distress.  Alert and oriented.  Speech fluent and  not dysarthric.  Language intact.  Eyes orthophoric on primary gaze.  Face symmetric.  Assessment and Plan:   Chronic migraine without aura, without status migrainosus, intractable  1.  For preventative management, Aimovig 70mg  2.  For abortive therapy, Ubrelvy 100mg .  Stop all OTC analgesics and sumatriptan 3.  Limit use of pain relievers to no more than 2 days out of week to prevent risk of rebound or medication-overuse headache. 4.  Keep headache diary 5.  Exercise, hydration, caffeine cessation, sleep hygiene, monitor for and avoid triggers 6.  Consider:  magnesium citrate 400mg  daily, riboflavin 400mg  daily, and coenzyme Q10 100mg  three times daily 7. Always keep in mind that currently taking a hormone or birth control may be a possible trigger or aggravating factor for migraine. 8. Follow up 4 months.   Follow Up Instructions:    -I discussed the  assessment and treatment plan with the patient. The patient was provided an opportunity to ask questions and all were answered. The patient agreed with the plan and demonstrated an understanding of the instructions.   The patient was advised to call back or seek an in-person evaluation if the symptoms worsen or if the condition fails to improve as anticipated.   Cira ServantAdam Robert Rachel Escalona, DO

## 2019-05-22 ENCOUNTER — Telehealth (INDEPENDENT_AMBULATORY_CARE_PROVIDER_SITE_OTHER): Payer: No Typology Code available for payment source | Admitting: Neurology

## 2019-05-22 ENCOUNTER — Encounter: Payer: Self-pay | Admitting: Neurology

## 2019-05-22 ENCOUNTER — Other Ambulatory Visit: Payer: Self-pay

## 2019-05-22 VITALS — Ht 62.5 in | Wt 148.0 lb

## 2019-05-22 DIAGNOSIS — G43719 Chronic migraine without aura, intractable, without status migrainosus: Secondary | ICD-10-CM | POA: Diagnosis not present

## 2019-05-22 MED ORDER — AIMOVIG 70 MG/ML ~~LOC~~ SOAJ: 70.0000 mg | SUBCUTANEOUS | 3 refills | Status: DC

## 2019-05-22 MED ORDER — UBRELVY 100 MG PO TABS: 1.0000 | ORAL_TABLET | ORAL | 3 refills | Status: DC | PRN

## 2019-05-22 NOTE — Patient Instructions (Signed)
Migraine Recommendations: 1.  Start Aimovig 70mg  injection every 30 days 2.  Take Ubrelvy 100mg  at earliest onset of headache.  May repeat dose once in 2 hours if needed.  Do not exceed two in 24 hours. 3.  Limit use of pain relievers to no more than 2 days out of the week.  These medications include acetaminophen, ibuprofen, triptans and narcotics.  This will help reduce risk of rebound headaches. 4.  Be aware of common food triggers such as processed sweets, processed foods with nitrites (such as deli meat, hot dogs, sausages), foods with MSG, alcohol (such as wine), chocolate, certain cheeses, certain fruits (dried fruits, bananas, pineapple), vinegar, diet soda. 4.  Avoid caffeine 5.  Routine exercise 6.  Proper sleep hygiene 7.  Stay adequately hydrated with water 8.  Keep a headache diary. 9.  Maintain proper stress management. 10.  Do not skip meals. 11.  Consider supplements:  Magnesium citrate 400mg  to 600mg  daily, riboflavin 400mg , Coenzyme Q 10 100mg  three times daily

## 2019-05-27 ENCOUNTER — Encounter: Payer: Self-pay | Admitting: Family Medicine

## 2019-06-02 MED FILL — UBRELVY 100 MG TABS: 100 | 8 days supply | Qty: 16 | Fill #0

## 2019-06-02 MED FILL — AIMOVIG 70 MG/ML SOAJ: 70 | 30 days supply | Qty: 1 | Fill #0

## 2019-06-04 ENCOUNTER — Encounter: Payer: Self-pay | Admitting: Family Medicine

## 2019-06-06 ENCOUNTER — Telehealth: Payer: Self-pay | Admitting: Neurology

## 2019-06-06 NOTE — Telephone Encounter (Signed)
James from cover my meds left message with after hour service on 06-06-19 at 12:45   Ref number [id number]  Regarding Ubrelvy medication

## 2019-06-12 ENCOUNTER — Other Ambulatory Visit: Payer: Self-pay | Admitting: Family Medicine

## 2019-06-12 MED ORDER — BUSPIRONE HCL 15 MG PO TABS: ORAL_TABLET | ORAL | 2 refills | Status: DC

## 2019-06-13 MED FILL — busPIRone HCL 15 MG TABS: 15 | 30 days supply | Qty: 60 | Fill #0

## 2019-06-20 ENCOUNTER — Other Ambulatory Visit: Payer: Self-pay

## 2019-06-20 ENCOUNTER — Ambulatory Visit (INDEPENDENT_AMBULATORY_CARE_PROVIDER_SITE_OTHER): Payer: No Typology Code available for payment source | Admitting: Orthopaedic Surgery

## 2019-06-20 ENCOUNTER — Encounter: Payer: Self-pay | Admitting: Orthopaedic Surgery

## 2019-06-20 VITALS — BP 127/96 | HR 108 | Ht 62.5 in | Wt 150.0 lb

## 2019-06-20 DIAGNOSIS — Q686 Discoid meniscus: Secondary | ICD-10-CM

## 2019-06-20 DIAGNOSIS — M25561 Pain in right knee: Secondary | ICD-10-CM

## 2019-06-20 DIAGNOSIS — G8929 Other chronic pain: Secondary | ICD-10-CM

## 2019-06-20 MED ORDER — METHYLPREDNISOLONE ACETATE 40 MG/ML IJ SUSP
80.0000 mg | INTRAMUSCULAR | Status: AC | PRN
  Administered 2019-06-20: 80 mg via INTRA_ARTICULAR

## 2019-06-20 MED ORDER — LIDOCAINE HCL 1 % IJ SOLN
2.0000 mL | INTRAMUSCULAR | Status: AC | PRN
  Administered 2019-06-20: 15:00:00 2 mL

## 2019-06-20 MED ORDER — BUPIVACAINE HCL 0.5 % IJ SOLN
2.0000 mL | INTRAMUSCULAR | Status: AC | PRN
  Administered 2019-06-20: 15:00:00 2 mL via INTRA_ARTICULAR

## 2019-06-20 NOTE — Progress Notes (Addendum)
Office Visit Note   Patient: Rachel [name]           Date of Birth: Jul 29, 1996           MRN: [id number] Visit Date: 06/20/2019              Requested by: Caren Macadam, MD [place],  [place] [id number] PCP: Caren Macadam, MD   Assessment & Plan:  Plan: 8 months status post right knee arthroscopy with a partial meniscectomy of a discoid meniscus and decompression of the lateral meniscal cyst.  Did very well for months only to have some recurrence of pain with increased activity.  She does work and ultrasound at the hospital and did an ultrasound of her knee demonstrating some persistent cyst formation anterolaterally with a thickened wall.  I am going to inject the area of tenderness with cortisone and monitor response and then consider an MRI scan if not any better.  She might be a candidate for surgical excision of the cyst with repeat arthroscopy  Follow-Up Instructions: Return if symptoms worsen or fail to improve.   Orders:  Orders Placed This Encounter  Procedures  . Large Joint Inj: L knee   Meds ordered this encounter  Medications  . bupivacaine (MARCAINE) 0.5 % (with pres) injection 2 mL  . lidocaine (XYLOCAINE) 1 % (with pres) injection 2 mL  . methylPREDNISolone acetate (DEPO-MEDROL) injection 80 mg      Procedures: Large Joint Inj: L knee on 06/20/2019 2:38 PM Indications: pain and diagnostic evaluation Details: 25 G 1.5 in needle, anterolateral approach  Arthrogram: No  Medications: 2 mL lidocaine 1 %; 2 mL bupivacaine 0.5 %; 80 mg methylPREDNISolone acetate 40 MG/ML Procedure, treatment alternatives, risks and benefits explained, specific risks discussed. Consent was given by the patient. Patient was prepped and draped in the usual sterile fashion.     Should indicate right not left knee  Clinical Data: No additional findings.   Subjective: Chief Complaint  Patient presents with  . Right Knee - Routine Post Op    Right  knee scope DOS 11/08/2018  Patient presents today for a follow up on her right knee. She had a right knee scope on 11/08/2018. Patient states that 80% of the time her knee is painful. She is unable to walk after working 10hours on her feet. She does not take anything for pain. She also states that her cyst has gotten larger. She ultrasounded her knee at work and states that there are two cyst now, instead of just the one. When last seen she was doing very well from a knee scope without any compromise of her activities.  She notes that she has had some recurrence of her pain over the last several months prompting her office visit.  No specific injury or trauma but she has been active and strengthening her lower extremities  HPI  Review of Systems   Objective: Vital Signs: BP (!) 127/96   Pulse (!) 108   Ht 5' 2.5" (1.588 m)   Wt 150 lb (68 kg)   BMI 27.00 kg/m   Physical Exam Constitutional:      Appearance: She is well-developed.  Eyes:     Pupils: Pupils are equal, round, and reactive to light.  Pulmonary:     Effort: Pulmonary effort is normal.  Skin:    General: Skin is warm and dry.  Neurological:     Mental Status: She is alert and oriented to person,  place, and time.  Psychiatric:        Behavior: Behavior normal.     Ortho Exam awake alert and oriented x3.  Comfortable sitting.  Right knee was not effused or hot or red.  There was just a little tenderness along the anterior lateral aspect of her knee with some fullness that could represent the cyst.  Even she admits that the cyst is barely palpable today.  Some days it appears to be larger.  No specific joint pain posteriorly or medially and no patellar discomfort.  No instability  Specialty Comments:  No specialty comments available.  Imaging: No results found.   PMFS History: Patient Active Problem List   Diagnosis Date Noted  . Chronic pain of right knee 11/22/2018  . Migraine 07/06/2018  . Anxiety 07/06/2018    Past Medical History:  Diagnosis Date  . Asthma    as a child   . Headache     Family History  Problem Relation Age of Onset  . Bipolar disorder Mother   . Schizophrenia Mother        significant paranoia  . OCD Mother   . Migraines Mother   . Atrial fibrillation Father   . High Cholesterol Father   . Migraines Sister   . CAD Maternal Grandmother   . Vascular Disease Maternal Grandmother   . Mental illness Paternal Grandmother   . Migraines Sister   . Migraines Sister   . High Cholesterol Paternal Grandfather   . Heart disease Paternal Grandfather   . Migraines Paternal Grandfather     Past Surgical History:  Procedure Laterality Date  . KNEE ARTHROSCOPY Left 2013   meniscus repair  . KNEE ARTHROSCOPY WITH LATERAL MENISECTOMY Right 11/08/2018   Procedure: RIGHT KNEE ARTHROSCOPY WITH LATERAL MENISECTOMY, EXCISION MENISCAL CYST LATERAL;  Surgeon: Valeria BatmanWhitfield, Peter W, MD;  Location: [facility];  Service: Orthopedics;  Laterality: Right;   Social History   Occupational History  . 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 Smoking status: Never Smoker  . Smokeless tobacco: Never Used  Substance and Sexual Activity  . Alcohol use: Yes    Comment: occasional  . Drug use: Never  . Sexual activity: Yes    Partners: Male

## 2019-06-29 NOTE — Telephone Encounter (Signed)
I am not aware of any other number and called the number below and message stated to leave a message.  Attempted to skype 2 employees that work there and they are offline.  Message sent to Dr Ethlyn Gallery.

## 2019-06-29 NOTE — Telephone Encounter (Signed)
Jo-Anne - that is the number I have (please see Chaneka's message) - do you have any other numbers she can try? Let me know if my number is not the preferred one please!

## 2019-07-04 MED FILL — AIMOVIG 70 MG/ML SOAJ: 70 | 30 days supply | Qty: 1 | Fill #1

## 2019-07-06 ENCOUNTER — Telehealth: Payer: Self-pay | Admitting: Orthopaedic Surgery

## 2019-07-06 ENCOUNTER — Other Ambulatory Visit: Payer: Self-pay | Admitting: Orthopaedic Surgery

## 2019-07-06 DIAGNOSIS — G8929 Other chronic pain: Secondary | ICD-10-CM

## 2019-07-06 DIAGNOSIS — Q686 Discoid meniscus: Secondary | ICD-10-CM

## 2019-07-06 NOTE — Telephone Encounter (Signed)
MRI left knee

## 2019-07-06 NOTE — Telephone Encounter (Signed)
Called patient. No answer. Left message that I need clarification on her knee pain. Dr.Whitfield's last note states that he injected her left knee. Did he dictate the wrong side? If so, I will need to get that changed before ordering the MRI.

## 2019-07-06 NOTE — Telephone Encounter (Signed)
Called patient. No answer. Left message that order has been placed for MRI of Left knee.

## 2019-07-06 NOTE — Telephone Encounter (Signed)
Please advise 

## 2019-07-06 NOTE — Telephone Encounter (Signed)
Patient called stating the cortisone injection only gave her relief for about 1 week.  Patient states Dr. Durward Fortes told her to call the office if her knee didn't improve and he would order an MRI.

## 2019-07-06 NOTE — Telephone Encounter (Signed)
Patient called stating she was returning your call regarding her MRI.  Patient states the MRI is for her right knee not left.

## 2019-07-07 ENCOUNTER — Other Ambulatory Visit: Payer: Self-pay | Admitting: Orthopaedic Surgery

## 2019-07-07 DIAGNOSIS — G8929 Other chronic pain: Secondary | ICD-10-CM

## 2019-07-07 DIAGNOSIS — M25561 Pain in right knee: Secondary | ICD-10-CM

## 2019-07-07 NOTE — Telephone Encounter (Signed)
Please see below.Marland KitchenMarland KitchenI need you to addend her last note so that I can order her MRI of her right knee.

## 2019-07-07 NOTE — Telephone Encounter (Signed)
Patient returned your call stating Dr. Durward Fortes gave her the cortisone injection in her Right knee and the MRI is for her Right knee.

## 2019-07-07 NOTE — Telephone Encounter (Signed)
Corrected last office note to reflect right knee not left

## 2019-07-07 NOTE — Telephone Encounter (Signed)
Spoke with patient and notified her that order has been placed for the MRI of her right knee.

## 2019-07-08 MED FILL — UBRELVY 100 MG TABS: 100 | 5 days supply | Qty: 16 | Fill #1

## 2019-07-12 ENCOUNTER — Telehealth: Payer: Self-pay | Admitting: Orthopaedic Surgery

## 2019-07-12 NOTE — Telephone Encounter (Signed)
Please see below.

## 2019-07-12 NOTE — Telephone Encounter (Signed)
Authorization is noted in referral, patient is scheduled 9/19 at 6:10pm

## 2019-07-12 NOTE — Telephone Encounter (Signed)
Patient called stating she called her insurance company to see if they approved the MRI for her right knee and was told they haven't received anything from the doctor's office.  Patient is requesting a return call to check the status of the referral.

## 2019-07-15 ENCOUNTER — Ambulatory Visit
Admission: RE | Admit: 2019-07-15 | Discharge: 2019-07-15 | Disposition: A | Discharge status: Home / Self Care | Payer: No Typology Code available for payment source | Source: Ambulatory Visit | Attending: Orthopaedic Surgery | Admitting: Orthopaedic Surgery

## 2019-07-15 ENCOUNTER — Other Ambulatory Visit: Payer: Self-pay

## 2019-07-15 DIAGNOSIS — G8929 Other chronic pain: Secondary | ICD-10-CM

## 2019-07-15 DIAGNOSIS — M25561 Pain in right knee: Secondary | ICD-10-CM

## 2019-07-17 ENCOUNTER — Other Ambulatory Visit: Payer: Self-pay | Admitting: Family Medicine

## 2019-07-18 ENCOUNTER — Encounter: Payer: Self-pay | Admitting: *Deleted

## 2019-07-18 ENCOUNTER — Other Ambulatory Visit: Payer: Self-pay

## 2019-07-18 ENCOUNTER — Encounter: Payer: Self-pay | Admitting: Orthopaedic Surgery

## 2019-07-18 ENCOUNTER — Ambulatory Visit: Payer: No Typology Code available for payment source | Admitting: Orthopaedic Surgery

## 2019-07-18 VITALS — BP 140/79 | HR 61 | Ht 62.5 in | Wt 150.0 lb

## 2019-07-18 DIAGNOSIS — M25561 Pain in right knee: Secondary | ICD-10-CM | POA: Diagnosis not present

## 2019-07-18 DIAGNOSIS — G8929 Other chronic pain: Secondary | ICD-10-CM | POA: Diagnosis not present

## 2019-07-18 MED FILL — ALYACEN 1-35-28 TABLET: 1-35 | 84 days supply | Qty: 84 | Fill #0

## 2019-07-18 NOTE — Progress Notes (Addendum)
Rachel [name] (Key: [id number]) Aimovig 70MG /ML auto-injectors   Form Med[employer]act ePA Form Created 3 days ago Sent to Plan 3 days ago Plan Response 3 days ago Submit Clinical Questions 3 days ago Determination Favorable 17 hours ago Message from Plan The request has been approved. The authorization is effective for a maximum of 6 fills from 07/18/2019 to 01/14/2020, as long as the member is enrolled in their current health plan. The request was approved as submitted. This request has been approved for 33mL per 30 days. Renewal requires that the patient has experienced a reduction in migraine or headache frequency of at least 2 days per month, OR that the patient has experienced a reduction in migraine severity OR migraine duration with Aimovig therapy. A written notification letter will follow with additional details.Impact to return a determination.

## 2019-07-18 NOTE — Progress Notes (Signed)
Office Visit Note   Patient: Rachel [name]           Date of Birth: October 07, 1996           MRN: [id number] Visit Date: 07/18/2019              Requested by: Caren Macadam, MD [place],   [id number] PCP: Caren Macadam, MD   Assessment & Plan: Visit Diagnoses:  1. Chronic pain of right knee     Plan: MRI scan demonstrates no pathology of cruciate or collateral ligaments.  No tearing of the lateral meniscus.  There is a persistent lateral meniscal cyst which is slightly smaller than it was before she had partial medial meniscectomy.  There is also some increased marrow signal in the lateral tibial plateau and posterior lateral femoral condyle which may be related to her recent kickboxing and activity on her feet.  She is feeling a little bit better.  I am not sure if the cyst is actually causing her problem or its the edema.  1 option would be to repeat the knee arthroscopy and excise the anterior cyst.  I would like Dr. Marlou Sa to evaluate and see what he thinks  Follow-Up Instructions: Return Will ask Dr. Marlou Sa for another opinion.   Orders:  No orders of the defined types were placed in this encounter.  No orders of the defined types were placed in this encounter.     Procedures: No procedures performed   Clinical Data: No additional findings.   Subjective: Chief Complaint  Patient presents with  . Right Knee - Follow-up  Patient presents today for follow up on her right knee. She had an MRI on 07/15/2019 and is here today for those results. She has a history of a right knee scope on 11/08/2018. Rachel [name] relates that her present pain may be a 3/10 and definitely better than it has been over the last month or so.  This could be related to the fact that she has not been as active.  When she is on her feet for prolonged periods of time she will experience more pain along the anterior lateral aspect of her right knee.  HPI  Review of Systems    Objective: Vital Signs: BP 140/79   Pulse 61   Ht 5' 2.5" (1.588 m)   Wt 150 lb (68 kg)   BMI 27.00 kg/m   Physical Exam  Ortho Exam right knee with well-healed arthroscopic portals.  Effusion.  The cyst is really not palpable but she does have some mild tenderness along the anterior lateral aspect of her knee.  No pain over the lateral femoral condyle lateral tibial plateau.  Specialty Comments:  No specialty comments available.  Imaging: No results found.   PMFS History: Patient Active Problem List   Diagnosis Date Noted  . Chronic pain of right knee 11/22/2018  . Migraine 07/06/2018  . Anxiety 07/06/2018   Past Medical History:  Diagnosis Date  . Asthma    as a child   . Headache     Family History  Problem Relation Age of Onset  . Bipolar disorder Mother   . Schizophrenia Mother        significant paranoia  . OCD Mother   . Migraines Mother   . Atrial fibrillation Father   . High Cholesterol Father   . Migraines Sister   . CAD Maternal Grandmother   . Vascular Disease Maternal Grandmother   . Mental  illness Paternal Grandmother   . Migraines Sister   . Migraines Sister   . High Cholesterol Paternal Grandfather   . Heart disease Paternal Grandfather   . Migraines Paternal Grandfather     Past Surgical History:  Procedure Laterality Date  . KNEE ARTHROSCOPY Left 2013   meniscus repair  . KNEE ARTHROSCOPY WITH LATERAL MENISECTOMY Right 11/08/2018   Procedure: RIGHT KNEE ARTHROSCOPY WITH LATERAL MENISECTOMY, EXCISION MENISCAL CYST LATERAL;  Surgeon: [name], MD;  Location: [facility];  Service: Orthopedics;  Laterality: Right;   Social History   Occupational History  . Occupation: Engineer, maintenance (IT): [employer]  Tobacco Use  . Smoking status: Never Smoker  . Smokeless tobacco: Never Used  Substance and Sexual Activity  . Alcohol use: Yes    Comment: occasional  . Drug use: Never  . Sexual activity: Yes     Partners: Male

## 2019-07-18 NOTE — Progress Notes (Addendum)
Rachel [name] (Key: [id number]) Roselyn Meier 100MG  OR TABS   Form MedImpact ePA Form Created 3 days ago Sent to Plan 3 days ago Plan Response 3 days ago Submit Clinical Questions 3 days ago Determination Unfavorable 15 hours ago Message from Plan The request has been partially approved. The authorization is effective for a maximum of 6 fills from 07/08/2019 to 01/04/2020, as long as the member is enrolled in their current health plan. This request is approved for up to 16 tablets per 30 days. We were asked to cover a larger amount of the drug listed at the top of this letter than allowed under our quantity limit rule. Your doctor requested a quantity of 16 tablets per 8 days, but you were approved for a lower quantity on based our Pharmacy Benefit Formulary Exception Process because your plan covers a maximum of 16 tablets per 30 days. Contact your doctor to discuss use of this medication at the quantity that has been approved and other alternatives further. A written notification letter will follow with additional details.  I called [phone] and was told it is not necessary to change it from 8 days to 30 days.

## 2019-07-19 ENCOUNTER — Telehealth (INDEPENDENT_AMBULATORY_CARE_PROVIDER_SITE_OTHER): Payer: No Typology Code available for payment source | Admitting: Family Medicine

## 2019-07-19 ENCOUNTER — Other Ambulatory Visit: Payer: Self-pay

## 2019-07-19 ENCOUNTER — Encounter: Payer: Self-pay | Admitting: Family Medicine

## 2019-07-19 DIAGNOSIS — G43719 Chronic migraine without aura, intractable, without status migrainosus: Secondary | ICD-10-CM

## 2019-07-19 DIAGNOSIS — G8929 Other chronic pain: Secondary | ICD-10-CM

## 2019-07-19 DIAGNOSIS — F419 Anxiety disorder, unspecified: Secondary | ICD-10-CM

## 2019-07-19 DIAGNOSIS — M25561 Pain in right knee: Secondary | ICD-10-CM

## 2019-07-19 NOTE — Progress Notes (Signed)
Virtual Visit via Video Note  I connected with Rachel [name]   on 07/19/19 at  8:30 AM EDT by a video enabled telemedicine application and verified that I am speaking with the correct person using two identifiers.  Location patient: home Location provider:work office Persons participating in the virtual visit: patient, provider  I discussed the limitations of evaluation and management by telemedicine and the availability of in person appointments. The patient expressed understanding and agreed to proceed.   Rachel [name] DOB: 1996/06/07 Encounter date: 07/19/2019  This is a 23 y.o. female who presents to establish care. No chief complaint on file.   History of present illness: Following with neuro: taking just the ubrelvy if needed for migraine. Stopped taking otc ibuprofen. Ruel Favors when she has headache - sometimes not needing at all, but sometimes using twice weekly. Feels that aimovig helps. Gets more progression of headaches at end of month and better closer to injection. Sometimes still waking with headache. Has cut out majority of caffeine. Has increased water intake.   Has still been doing plant based diet. Less sweets, less processed foods. Sometimes eats fish or chicken once a meat. Migraines gets much worse when she eats red meat. Bloating has improved. Not craving junk any more because she feels poorly when she eats processed food.   Doing kick boxing 3 times/week. Knee is having harder time with doing this. Has followed with ortho and they are still evaluating/determining what to do. Wanted her to only bike or swim. Was referred for second opinion. Not sure if pain from cyst or bone. She has a fear of water, and has noticed that biking irritates knee. Thinking it will be easier for her to modify kick boxing because she likes this and it works with work schedule.   Heard back from behavioral health last week. Has appointment scheduled in 1 month (with [place]). She is excited  about this. Mood has been better when she works out - energy better, mood better. Good for frustration.   Hasn't had panic attack in 1-2 months. But still a lot of stress. Work has been stressful. Call outs at work. Also still fully in school. Either doing work, covering shifts, working over time, or doing school work. Even gets called on off days. Graduates from [employer] [place] college this semester and then getting BA. She has a hard time sitting still. Always needs to be doing something. She needs everything to go as planned. Gets very irritated/mad if something doesn't go her way.  Past Medical History:  Diagnosis Date  . Asthma    as a child   . Headache    Past Surgical History:  Procedure Laterality Date  . KNEE ARTHROSCOPY Left 2013   meniscus repair  . KNEE ARTHROSCOPY WITH LATERAL MENISECTOMY Right 11/08/2018   Procedure: RIGHT KNEE ARTHROSCOPY WITH LATERAL MENISECTOMY, EXCISION MENISCAL CYST LATERAL;  Surgeon: [name], MD;  Location: [facility];  Service: Orthopedics;  Laterality: Right;   Allergies  Allergen Reactions  . Sulfur Hives  . Venlafaxine     Migraine, hot flashes   Current Meds  Medication Sig  . ALAYCEN 1/35 tablet TAKE 1 TABLET BY MOUTH ONCE DAILY  . Erenumab-aooe (AIMOVIG) 70 MG/ML SOAJ Inject 70 mg into the skin every 30 (thirty) days.  Marland Kitchen Ubrogepant (UBRELVY) 100 MG TABS Take 1 tablet by mouth as needed (May repeat dose after 2 hours.  Maximum 2 tablets in 24 hours).  . [DISCONTINUED] busPIRone (BUSPAR) 15 MG  tablet Start with 1/2 tab twice daily and increase after 7 days to full tab twice daily.   Social History   Tobacco Use  . Smoking status: Never Smoker  . Smokeless tobacco: Never Used  Substance Use Topics  . Alcohol use: Yes    Comment: occasional   Family History  Problem Relation Age of Onset  . Bipolar disorder Mother   . Schizophrenia Mother        significant paranoia  . OCD Mother   . Migraines Mother   . Atrial  fibrillation Father   . High Cholesterol Father   . Migraines Sister   . CAD Maternal Grandmother   . Vascular Disease Maternal Grandmother   . Mental illness Paternal Grandmother   . Migraines Sister   . Migraines Sister   . High Cholesterol Paternal Grandfather   . Heart disease Paternal Grandfather   . Migraines Paternal Grandfather      Review of Systems  Objective:  There were no vitals taken for this visit.      BP Readings from Last 3 Encounters:  07/18/19 140/79  06/20/19 (!) 127/96  01/19/19 112/80   Wt Readings from Last 3 Encounters:  07/18/19 150 lb (68 kg)  06/20/19 150 lb (68 kg)  05/22/19 148 lb (67.1 kg)    EXAM:  GENERAL: alert, oriented, appears well and in no acute distress  HEENT: atraumatic, conjunctiva clear, no obvious abnormalities on inspection of external nose and ears  NECK: normal movements of the head and neck  LUNGS: on inspection no signs of respiratory distress, breathing rate appears normal, no obvious gross SOB, gasping or wheezing  CV: no obvious cyanosis  MS: moves all visible extremities without noticeable abnormality  PSYCH/NEURO: pleasant and cooperative, no obvious depression or anxiety, speech and thought processing grossly intact  Depression screen [facility] 2/9 07/19/2019 07/06/2018  Decreased Interest 1 1  Down, Depressed, Hopeless 0 0  PHQ - 2 Score 1 1  Altered sleeping - 3  Tired, decreased energy - 1  Change in appetite - 0  Feeling bad or failure about yourself  - 0  Trouble concentrating - 3  Moving slowly or fidgety/restless - 3  Suicidal thoughts - 0  PHQ-9 Score - 11  Difficult doing work/chores - Not difficult at all   GAD 7 : Generalized Anxiety Score 07/19/2019  Nervous, Anxious, on Edge 1  Control/stop worrying 3  Worry too much - different things 3  Trouble relaxing 3  Restless 3  Easily annoyed or irritable 3  Afraid - awful might happen 1  Total GAD 7 Score 17  Anxiety Difficulty Somewhat  difficult    Can distract self sometimes with anxious thoughts. Tries not to say anything when she is irritated; tries to avoid others because she knows she is blunt and could be offensive.   SKIN: no facial or neck abnormalities  Assessment/Plan  1. Intractable chronic migraine without aura and without status migrainosus Doing better with healthy lifestyle changes. Migraines are better controlled. Still following with neurology.   2. Anxiety Still significant issue, but she feels better off of medications. She had difficulty with tolerating: buspar, zoloft, effexor. She is looking forward to meeting with therapist.   3. Chronic pain of right knee Following with ortho. She is being cautious to not strain knee. Working on daily exercise that doesn't aggravate knee as this seems to help with migraine, stress level overall.     Return in about 6 months (around 01/16/2020)  for Chronic condition visit. Encouraged her to reach out sooner if needed, but she will be working with therapist, ortho, and neurology for above issues and she is stable now off of medications.  I discussed the assessment and treatment plan with the patient. The patient was provided an opportunity to ask questions and all were answered. The patient agreed with the plan and demonstrated an understanding of the instructions.   The patient was advised to call back or seek an in-person evaluation if the symptoms worsen or if the condition fails to improve as anticipated.  I provided 30 minutes of non-face-to-face time during this encounter.   Theodis Shove, MD

## 2019-07-20 ENCOUNTER — Telehealth: Payer: Self-pay | Admitting: *Deleted

## 2019-07-20 ENCOUNTER — Ambulatory Visit: Payer: No Typology Code available for payment source | Admitting: Orthopaedic Surgery

## 2019-07-20 NOTE — Telephone Encounter (Signed)
Called the pt and she stated she scheduled the appt via [place].

## 2019-07-20 NOTE — Telephone Encounter (Signed)
-----   Message from Caren Macadam, MD sent at 07/19/2019  9:09 AM EDT ----- Please schedule CCV in 6 mo for her

## 2019-07-23 ENCOUNTER — Other Ambulatory Visit: Payer: No Typology Code available for payment source

## 2019-07-28 ENCOUNTER — Encounter: Payer: Self-pay | Admitting: Orthopedic Surgery

## 2019-07-28 ENCOUNTER — Ambulatory Visit (INDEPENDENT_AMBULATORY_CARE_PROVIDER_SITE_OTHER): Payer: No Typology Code available for payment source | Admitting: Orthopedic Surgery

## 2019-07-28 DIAGNOSIS — M23006 Cystic meniscus, unspecified meniscus, right knee: Secondary | ICD-10-CM | POA: Diagnosis not present

## 2019-07-28 NOTE — Progress Notes (Signed)
Office Visit Note   Patient: Rachel [name]           Date of Birth: 01-09-1996           MRN: [id number] Visit Date: 07/28/2019 Requested by: Caren Macadam, MD [place],  [place] [address] PCP: Caren Macadam, MD  Subjective: Chief Complaint  Patient presents with   Right Knee - Pain    HPI: Rachel [name] is a patient with right knee pain.  Patient had MRI scan in October 2019 which showed lateral meniscal pathology and a lateral meniscal cyst.  She underwent surgical intervention in January 2020 for the right knee.  She has had recurrence of the cyst as well as recurrent pain in the lateral aspect of the knee.  Repeat MRI scanning in September of this year shows persistence of that lateral meniscal cyst with some bowing of the iliotibial band as well as edema within the lateral tibial plateau which is about 1 to 2 cm below the joint surface.  Patient cannot take anti-inflammatories.  She has been on a plant-based diet.  Her vitamin D status is unknown but she states she just started taking vitamin D about 4000 international units a day for the past 2 weeks.  She has had a steroid injection which helped for a week and the last one was a month ago.  She states her pain is worse when she is bending the knee.  She is very active.  She does report some pain with weightbearing.  She did very well with left knee arthroscopy and partial medial meniscectomy 2 years ago.  She is here for second opinion regarding optimal surgical and/or nonsurgical treatment.              ROS: All systems reviewed are negative as they relate to the chief complaint within the history of present illness.  Patient denies  fevers or chills.   Assessment & Plan: Visit Diagnoses:  1. Meniscal cyst, right     Plan: Impression is focal right knee pain with persistent lateral meniscal cyst which does appear to have mass-effect on the iliotibial band and extension.  Patient also has new bone edema  about 1/2 to 2 cm below the joint surface on the lateral tibial plateau.  This appears to be an early stress reaction.  May be related to vitamin D status.  Question at hand is whether or not surgical intervention is indicated for this persistent cyst.  I think that she is optimizing treatment for the bone edema in the lateral tibial plateau by taking vitamin D and modifying her activity.  She still has very focal pain in the region of the cyst.  Because of the mass-effect present and the persistent size of that meniscal cyst and its palpable nature just on exam I think it would be reasonable to excise that cyst and suture the peripheral meniscal cleft where it is originating from.  She does want to try to become more active and she has not really been able to do that because of this lateral knee pain.  I will see her back as needed.  Follow-Up Instructions: Return if symptoms worsen or fail to improve.   Orders:  No orders of the defined types were placed in this encounter.  No orders of the defined types were placed in this encounter.     Procedures: No procedures performed   Clinical Data: No additional findings.  Objective: Vital Signs: There were no vitals  taken for this visit.  Physical Exam:   Constitutional: Patient appears well-developed HEENT:  Head: Normocephalic Eyes:EOM are normal Neck: Normal range of motion Cardiovascular: Normal rate Pulmonary/chest: Effort normal Neurologic: Patient is alert Skin: Skin is warm Psychiatric: Patient has normal mood and affect    Ortho Exam: Ortho exam demonstrates normal gait alignment.  Full range of motion of the right knee with tenderness at the lateral joint line primarily anteriorly.  Cyst is palpable as a garden pea size mass at the joint line.  Collateral and cruciate ligaments are stable.  McMurray compression testing equivocal.  No real tenderness to palpation along the lateral or medial tibial plateau.  No skin changes or  temperature or color differences right knee versus left knee.  No groin pain in the right leg with internal/external rotation and no nerve root tension signs.  Specialty Comments:  No specialty comments available.  Imaging: No results found.   PMFS History: Patient Active Problem List   Diagnosis Date Noted   Chronic pain of right knee 11/22/2018   Migraine 07/06/2018   Anxiety 07/06/2018   Past Medical History:  Diagnosis Date   Asthma    as a child    Headache     Family History  Problem Relation Age of Onset   Bipolar disorder Mother    Schizophrenia Mother        significant paranoia   OCD Mother    Migraines Mother    Atrial fibrillation Father    High Cholesterol Father    Migraines Sister    CAD Maternal Grandmother    Vascular Disease Maternal Grandmother    Mental illness Paternal Grandmother    Migraines Sister    Migraines Sister    High Cholesterol Paternal Grandfather    Heart disease Paternal Grandfather    Migraines Paternal Grandfather     Past Surgical History:  Procedure Laterality Date   KNEE ARTHROSCOPY Left 2013   meniscus repair   KNEE ARTHROSCOPY WITH LATERAL MENISECTOMY Right 11/08/2018   Procedure: RIGHT KNEE ARTHROSCOPY WITH LATERAL MENISECTOMY, EXCISION MENISCAL CYST LATERAL;  Surgeon: [name], MD;  Location: [facility];  Service: Orthopedics;  Laterality: Right;   Social History   Occupational History   Occupation: Engineer, maintenance (IT): [employer]  Tobacco Use   Smoking status: Never Smoker   Smokeless tobacco: Never Used  Substance and Sexual Activity   Alcohol use: Yes    Comment: occasional   Drug use: Never   Sexual activity: Yes    Partners: Male

## 2019-08-01 ENCOUNTER — Telehealth: Payer: Self-pay | Admitting: Orthopaedic Surgery

## 2019-08-01 NOTE — Telephone Encounter (Signed)
Discussed surgery-completed surgery sheet-please send to [facility]

## 2019-08-01 NOTE — Telephone Encounter (Signed)
Please advise 

## 2019-08-01 NOTE — Telephone Encounter (Signed)
Patient has seen Dr. Marlou Sa, and would like to know if she needs to return to Dr. Durward Fortes. Dr. Marlou Sa recommends surgery on right knee. Please call patient to advise.

## 2019-08-10 NOTE — H&P (Addendum)
Rachel [name] is an 23 y.o. female.   Chief Complaint: Painful right knee HPI: Rachel [name] is a very pleasant 23 year old who had an MRI scan of her right knee.SHe has had painformany years in the right knee.She has had a very similar problem in her left knee and underwent an arthroscopic meniscectomy of her left knee.   Recent MRI scan of the right knee revealed a small vertical tear along the superior surface of the anterior horn-body junction of the lateral meniscus. 7.7 x 2.5 x 5 mm multiloculated cystic structure along the periphery of the lateral meniscus most consistent with a parameniscal cyst.she continued to have persistent pain and discomfort in the knee. She underwent a RIGHT KNEE ARTHROSCOPY WITH LATERAL MENISECTOMY, DEBRIDEMENT  LATERALMENISCAL CYST.   He was examined and she has had recurrence of the cyst as well as recurrent pain in the lateral aspect of the knee.  Repeat MRI scanning in September of this year shows persistence of that lateral meniscal cyst with some bowing of the iliotibial band as well as edema within the lateral tibial plateau which is about 1 to 2 cm below the joint surface.   Plan is for repeat right knee arthroscope with lateral menicectomy and open excision lateral meniscal cyst.  Past Medical History:  Diagnosis Date  . Asthma    as a child   . Headache     Past Surgical History:  Procedure Laterality Date  . KNEE ARTHROSCOPY Left 2013   meniscus repair  . KNEE ARTHROSCOPY WITH LATERAL MENISECTOMY Right 11/08/2018   Procedure: RIGHT KNEE ARTHROSCOPY WITH LATERAL MENISECTOMY, EXCISION MENISCAL CYST LATERAL;  Surgeon: Garald Balding, MD;  Location: [facility];  Service: Orthopedics;  Laterality: Right;    Family History  Problem Relation Age of Onset  . Bipolar disorder Mother   . Schizophrenia Mother        significant paranoia  . OCD Mother   . Migraines Mother   . Atrial fibrillation Father   . High Cholesterol Father   .  Migraines Sister   . CAD Maternal Grandmother   . Vascular Disease Maternal Grandmother   . Mental illness Paternal Grandmother   . Migraines Sister   . Migraines Sister   . High Cholesterol Paternal Grandfather   . Heart disease Paternal Grandfather   . Migraines Paternal Grandfather    Social History:  reports that she has never smoked. She has never used smokeless tobacco. She reports current alcohol use. She reports that she does not use drugs.  Allergies:  Allergies  Allergen Reactions  . Sulfur Hives  . Venlafaxine     Migraine, hot flashes    No medications prior to admission.    No results found for this or any previous visit (from the past 48 hour(s)). No results found.  ROS  All systems reviewed are negative as they relate to the chief complaint within the history of present illness.  Patient denies  fevers or chills.  There were no vitals taken for this visit. Physical Exam  Constitutional: She is oriented to person, place, and time. She appears well-developed and well-nourished.  HENT:  Head: Normocephalic and atraumatic.  Eyes: Pupils are equal, round, and reactive to light. Conjunctivae and EOM are normal.  Neck: Neck supple.  Cardiovascular: Normal rate and regular rhythm.  Respiratory: Effort normal and breath sounds normal.  GI: Soft. Bowel sounds are normal.  Neurological: She is alert and oriented to person, place, and time.  Skin: Skin  is warm and dry.  Psychiatric: She has a normal mood and affect. Her behavior is normal. Judgment and thought content normal.   Musculoskeletal:  Full range of motion of the right knee with tenderness at the lateral joint line primarily anteriorly.  Cyst is palpable as a garden pea size mass at the joint line.  Collateral and cruciate ligaments are stable.  McMurray compression testing equivocal.  No real tenderness to palpation along the lateral or medial tibial plateau.  No skin changes or temperature or color differences  right knee versus left knee.  No groin pain in the right leg with internal/external rotation and no nerve root tension signs.  Assessment/Plan Lateral meniscal cyst  right knee, torn lateral meniscus  RIGHT KNEE ARTHROSCOPY WITH POSSIBLE LATERAL MENISECTOMY WITH  OPEN EXCISION LATERAL MENISCAL CYST    Oris Drone. Daphine Deutscher [phone]  08/15/2019 12:53 PM

## 2019-08-15 ENCOUNTER — Telehealth: Payer: Self-pay | Admitting: Neurology

## 2019-08-15 MED ORDER — UBRELVY 100 MG PO TABS: 1.0000 | ORAL_TABLET | ORAL | 3 refills | Status: DC | PRN

## 2019-08-15 MED FILL — UBRELVY 100 MG TABS: 100 | 8 days supply | Qty: 16 | Fill #0

## 2019-08-15 NOTE — Telephone Encounter (Signed)
Yes.  We can provide her with 5 refills.

## 2019-08-15 NOTE — Telephone Encounter (Signed)
Rx sent 

## 2019-08-15 NOTE — Telephone Encounter (Signed)
Westley Hummer from [facility], no phone number left, called and left a message stating the patient's Rachel [name] prior authorization is almost complete. He said a new prescription needs sent to [facility] for Ubrelvy 100 MG with an updated quantity and frequency. He said it needs changed from 16 tablets every 8 days to: 16 tablets every 30 days.

## 2019-08-15 NOTE — Telephone Encounter (Signed)
Ok to make theses changes?

## 2019-08-15 NOTE — Telephone Encounter (Signed)
Rx sent with update qty to pharmacy.

## 2019-08-17 ENCOUNTER — Telehealth: Payer: Self-pay | Admitting: Orthopaedic Surgery

## 2019-08-17 NOTE — Telephone Encounter (Signed)
Spoke with patient. She said that she is having increased pain in her right knee today. She is scheduled for a knee scope in a couple weeks with Dr.Whitfield and was unsure if Dr.Whitfield would want to do further imaging before her surgery.  I spoke with Dr.Whitifeld and let her know that he wants to proceed as normal. She understands.

## 2019-08-21 ENCOUNTER — Ambulatory Visit: Payer: No Typology Code available for payment source | Admitting: Clinical

## 2019-08-28 ENCOUNTER — Other Ambulatory Visit: Payer: Self-pay

## 2019-09-01 NOTE — H&P (Signed)
Rachel [name] is an 23 y.o. female.   Chief Complaint: Painful right knee HPI: [name] is a very pleasant 23 year old whohad anMRI scan of herright knee.SHe has had painformany years in the right knee.She has had a very similar problem in her left knee and underwent an arthroscopicmeniscectomy of her left knee. Recent MRI scan of the right knee revealed a small vertical tear along the superior surface of the anterior horn-body junction of the lateral meniscus. 7.7 x 2.5 x 5 mm multiloculated cystic structure along the periphery of the lateral meniscus most consistent with a parameniscal cyst.she continued to have persistent pain and discomfort in the knee. She underwent a RIGHT KNEE ARTHROSCOPY WITH LATERAL MENISECTOMY, DEBRIDEMENT LATERALMENISCAL CYST.   He was examined and she has had recurrence of the cyst as well as recurrent pain in the lateral aspect of the knee. Repeat MRI scanning in September of this year shows persistence of that lateral meniscal cyst with some bowing of the iliotibial band as well as edema within the lateral tibial plateau which is about 1 to 2 cm below the joint surface.   Plan is for repeat right knee arthroscope with lateral menicectomy and open excision lateral meniscal cyst.      Past Medical History:  Diagnosis Date  . Asthma    as a child   . Headache          Past Surgical History:  Procedure Laterality Date  . KNEE ARTHROSCOPY Left 2013   meniscus repair  . KNEE ARTHROSCOPY WITH LATERAL MENISECTOMY Right 11/08/2018   Procedure: RIGHT KNEE ARTHROSCOPY WITH LATERAL MENISECTOMY, EXCISION MENISCAL CYST LATERAL;  Surgeon: [name], MD;  Location: [facility];  Service: Orthopedics;  Laterality: Right;         Family History  Problem Relation Age of Onset  . Bipolar disorder Mother   . Schizophrenia Mother        significant paranoia  . OCD Mother   . Migraines Mother   . Atrial fibrillation Father    . High Cholesterol Father   . Migraines Sister   . CAD Maternal Grandmother   . Vascular Disease Maternal Grandmother   . Mental illness Paternal Grandmother   . Migraines Sister   . Migraines Sister   . High Cholesterol Paternal Grandfather   . Heart disease Paternal Grandfather   . Migraines Paternal Grandfather    Social History:  reports that she has never smoked. She has never used smokeless tobacco. She reports current alcohol use. She reports that she does not use drugs.  Allergies:       Allergies  Allergen Reactions  . Sulfur Hives  . Venlafaxine     Migraine, hot flashes    No medications prior to admission.    Lab Results Last 48 Hours  No results found for this or any previous visit (from the past 48 hour(s)).   Imaging Results (Last 48 hours)  No results found.    ROS  All systems reviewed are negative as they relate to the chief complaint within the history of present illness. Patient denies fevers or chills.  There were no vitals taken for this visit. Physical Exam  Constitutional: She is oriented to person, place, and time. She appears well-developed and well-nourished.  HENT:  Head: Normocephalic and atraumatic.  Eyes: Pupils are equal, round, and reactive to light. Conjunctivae and EOM are normal.  Neck: Neck supple.  Cardiovascular: Normal rate and regular rhythm.  Respiratory: Effort normal and  breath sounds normal.  GI: Soft. Bowel sounds are normal.  Neurological: She is alert and oriented to person, place, and time.  Skin: Skin is warm and dry.  Psychiatric: She has a normal mood and affect. Her behavior is normal. Judgment and thought content normal.   Musculoskeletal:  Full range of motion of the right knee with tenderness at the lateral joint line primarily anteriorly. Cyst is palpable as a garden pea size mass at the joint line. Collateral and cruciate ligaments are stable. McMurray compression testing equivocal.  No real tenderness to palpation along the lateral or medial tibial plateau. No skin changes or temperature or color differences right knee versus left knee. No groin pain in the right leg with internal/external rotation and no nerve root tension signs.  Assessment/Plan Lateral meniscal cyst  right knee, torn lateral meniscus  RIGHT KNEE ARTHROSCOPY WITH POSSIBLE LATERAL MENISECTOMY WITH  OPEN EXCISION LATERAL MENISCAL CYST    Mike Craze. Micheal Likens [phone]  09/01/2019 12:53 PM

## 2019-09-05 ENCOUNTER — Ambulatory Visit (HOSPITAL_BASED_OUTPATIENT_CLINIC_OR_DEPARTMENT_OTHER): Admit: 2019-09-05 | Payer: No Typology Code available for payment source | Admitting: Orthopaedic Surgery

## 2019-09-05 ENCOUNTER — Encounter (HOSPITAL_BASED_OUTPATIENT_CLINIC_OR_DEPARTMENT_OTHER): Payer: Self-pay

## 2019-09-05 ENCOUNTER — Encounter (HOSPITAL_BASED_OUTPATIENT_CLINIC_OR_DEPARTMENT_OTHER): Payer: Self-pay | Admitting: *Deleted

## 2019-09-05 SURGERY — ARTHROSCOPY, KNEE, WITH LATERAL MENISCECTOMY
Anesthesia: Choice | Site: Knee | Laterality: Right

## 2019-09-06 MED FILL — AIMOVIG 70 MG/ML SOAJ: 70 | 30 days supply | Qty: 1 | Fill #2

## 2019-09-08 ENCOUNTER — Other Ambulatory Visit (HOSPITAL_COMMUNITY)
Admission: RE | Admit: 2019-09-08 | Discharge: 2019-09-08 | Disposition: A | Discharge status: Home / Self Care | Payer: No Typology Code available for payment source | Source: Ambulatory Visit | Attending: Orthopaedic Surgery | Admitting: Orthopaedic Surgery

## 2019-09-08 ENCOUNTER — Other Ambulatory Visit: Payer: Self-pay

## 2019-09-08 ENCOUNTER — Telehealth: Payer: Self-pay | Admitting: Orthopaedic Surgery

## 2019-09-08 ENCOUNTER — Encounter (HOSPITAL_BASED_OUTPATIENT_CLINIC_OR_DEPARTMENT_OTHER)
Admission: RE | Admit: 2019-09-08 | Discharge: 2019-09-08 | Disposition: A | Discharge status: Home / Self Care | Payer: No Typology Code available for payment source | Source: Ambulatory Visit | Attending: Orthopaedic Surgery | Admitting: Orthopaedic Surgery

## 2019-09-08 DIAGNOSIS — Z20828 Contact with and (suspected) exposure to other viral communicable diseases: Secondary | ICD-10-CM | POA: Diagnosis not present

## 2019-09-08 DIAGNOSIS — Z01812 Encounter for preprocedural laboratory examination: Secondary | ICD-10-CM | POA: Diagnosis not present

## 2019-09-08 DIAGNOSIS — Z0182 Encounter for allergy testing: Secondary | ICD-10-CM | POA: Diagnosis present

## 2019-09-08 LAB — POCT PREGNANCY, URINE: Preg Test, Ur: NEGATIVE

## 2019-09-08 MED ORDER — ENSURE PRE-SURGERY PO LIQD: 296.0000 mL | Freq: Once | ORAL | Status: DC

## 2019-09-08 NOTE — Progress Notes (Addendum)

## 2019-09-08 NOTE — Telephone Encounter (Signed)
Patient has surgery on 09/12/19 w/Whitfield and needs a 1 week f/u. Please call patient to advise. No availability.  [phone]

## 2019-09-08 NOTE — Progress Notes (Signed)
Main lab called and stated there was no order for a surgical PCR for a swab that was sent down earlier today. Unable to place order since patient is not having surgery on the main campus. Lab was given number to the [facility] surgery center and was going to try to reach out to the RN that sent the swab.

## 2019-09-10 LAB — NOVEL CORONAVIRUS, NAA (HOSP ORDER, SEND-OUT TO REF LAB; TAT 18-24 HRS): SARS-CoV-2, NAA: NOT DETECTED

## 2019-09-11 NOTE — Telephone Encounter (Signed)
Please open a spot and schedule with Aaron Edelman on the 24th. Thanks!

## 2019-09-12 ENCOUNTER — Ambulatory Visit (HOSPITAL_BASED_OUTPATIENT_CLINIC_OR_DEPARTMENT_OTHER): Payer: No Typology Code available for payment source | Admitting: Anesthesiology

## 2019-09-12 ENCOUNTER — Inpatient Hospital Stay: Payer: No Typology Code available for payment source | Admitting: Orthopaedic Surgery

## 2019-09-12 ENCOUNTER — Encounter (HOSPITAL_BASED_OUTPATIENT_CLINIC_OR_DEPARTMENT_OTHER)
Admission: RE | Disposition: A | Discharge status: Home / Self Care | Payer: Self-pay | Source: Home / Self Care | Attending: Orthopaedic Surgery

## 2019-09-12 ENCOUNTER — Other Ambulatory Visit: Payer: Self-pay

## 2019-09-12 ENCOUNTER — Ambulatory Visit (HOSPITAL_BASED_OUTPATIENT_CLINIC_OR_DEPARTMENT_OTHER)
Admission: RE | Admit: 2019-09-12 | Discharge: 2019-09-12 | Disposition: A | Discharge status: Home / Self Care | Payer: No Typology Code available for payment source | Attending: Orthopaedic Surgery | Admitting: Orthopaedic Surgery

## 2019-09-12 ENCOUNTER — Encounter (HOSPITAL_BASED_OUTPATIENT_CLINIC_OR_DEPARTMENT_OTHER): Payer: Self-pay

## 2019-09-12 DIAGNOSIS — Z79899 Other long term (current) drug therapy: Secondary | ICD-10-CM | POA: Diagnosis not present

## 2019-09-12 DIAGNOSIS — J45909 Unspecified asthma, uncomplicated: Secondary | ICD-10-CM | POA: Insufficient documentation

## 2019-09-12 DIAGNOSIS — F419 Anxiety disorder, unspecified: Secondary | ICD-10-CM | POA: Diagnosis not present

## 2019-09-12 DIAGNOSIS — M23201 Derangement of unspecified lateral meniscus due to old tear or injury, left knee: Secondary | ICD-10-CM | POA: Insufficient documentation

## 2019-09-12 DIAGNOSIS — R519 Headache, unspecified: Secondary | ICD-10-CM | POA: Insufficient documentation

## 2019-09-12 DIAGNOSIS — M23241 Derangement of anterior horn of lateral meniscus due to old tear or injury, right knee: Secondary | ICD-10-CM | POA: Diagnosis not present

## 2019-09-12 DIAGNOSIS — M23000 Cystic meniscus, unspecified lateral meniscus, right knee: Secondary | ICD-10-CM | POA: Diagnosis not present

## 2019-09-12 DIAGNOSIS — M23041 Cystic meniscus, anterior horn of lateral meniscus, right knee: Secondary | ICD-10-CM | POA: Diagnosis not present

## 2019-09-12 DIAGNOSIS — S83289A Other tear of lateral meniscus, current injury, unspecified knee, initial encounter: Secondary | ICD-10-CM | POA: Diagnosis present

## 2019-09-12 DIAGNOSIS — M23009 Cystic meniscus, unspecified meniscus, unspecified knee: Secondary | ICD-10-CM | POA: Diagnosis present

## 2019-09-12 DIAGNOSIS — S83272D Complex tear of lateral meniscus, current injury, left knee, subsequent encounter: Secondary | ICD-10-CM

## 2019-09-12 HISTORY — PX: KNEE ARTHROSCOPY WITH LATERAL MENISECTOMY: SHX6193

## 2019-09-12 HISTORY — PX: CYST EXCISION: SHX5701

## 2019-09-12 SURGERY — ARTHROSCOPY, KNEE, WITH LATERAL MENISCECTOMY
Anesthesia: General | Site: Knee | Laterality: Right

## 2019-09-12 MED ORDER — OXYCODONE HCL 5 MG/5ML PO SOLN: 5.0000 mg | Freq: Once | ORAL | Status: AC | PRN

## 2019-09-12 MED ORDER — MEPERIDINE HCL 25 MG/ML IJ SOLN: 6.2500 mg | INTRAMUSCULAR | Status: DC | PRN

## 2019-09-12 MED ORDER — SODIUM CHLORIDE 0.9 % IR SOLN
Status: DC | PRN
  Administered 2019-09-12: 2500 mL

## 2019-09-12 MED ORDER — PROPOFOL 10 MG/ML IV BOLUS
INTRAVENOUS | Status: DC | PRN
  Administered 2019-09-12: 150 mg via INTRAVENOUS

## 2019-09-12 MED ORDER — CHLORHEXIDINE GLUCONATE 4 % EX LIQD: 60.0000 mL | Freq: Once | CUTANEOUS | Status: DC

## 2019-09-12 MED ORDER — BUPIVACAINE LIPOSOME 1.3 % IJ SUSP
INTRAMUSCULAR | Status: DC | PRN
  Administered 2019-09-12: 10 mL via PERINEURAL

## 2019-09-12 MED ORDER — FENTANYL CITRATE (PF) 100 MCG/2ML IJ SOLN
25.0000 ug | INTRAMUSCULAR | Status: DC | PRN
  Administered 2019-09-12 (×3): 50 ug via INTRAVENOUS

## 2019-09-12 MED ORDER — OXYCODONE HCL 5 MG PO TABS
ORAL_TABLET | ORAL | Status: AC
  Filled 2019-09-12: qty 1

## 2019-09-12 MED ORDER — KETOROLAC TROMETHAMINE 30 MG/ML IJ SOLN
INTRAMUSCULAR | Status: AC
  Filled 2019-09-12: qty 1

## 2019-09-12 MED ORDER — LACTATED RINGERS IV SOLN
INTRAVENOUS | Status: DC
  Administered 2019-09-12 (×3): via INTRAVENOUS

## 2019-09-12 MED ORDER — FENTANYL CITRATE (PF) 100 MCG/2ML IJ SOLN
100.0000 ug | Freq: Once | INTRAMUSCULAR | Status: AC
  Administered 2019-09-12: 12:00:00 100 ug via INTRAVENOUS

## 2019-09-12 MED ORDER — OXYCODONE HCL 5 MG PO TABS: 5.0000 mg | ORAL_TABLET | Freq: Four times a day (QID) | ORAL | 0 refills | Status: AC | PRN

## 2019-09-12 MED ORDER — FENTANYL CITRATE (PF) 100 MCG/2ML IJ SOLN
INTRAMUSCULAR | Status: AC
  Filled 2019-09-12: qty 2

## 2019-09-12 MED ORDER — ONDANSETRON HCL 4 MG/2ML IJ SOLN
INTRAMUSCULAR | Status: DC | PRN
  Administered 2019-09-12: 4 mg via INTRAVENOUS

## 2019-09-12 MED ORDER — LIDOCAINE-EPINEPHRINE (PF) 1 %-1:200000 IJ SOLN
INTRAMUSCULAR | Status: DC | PRN
  Administered 2019-09-12: 5 mL

## 2019-09-12 MED ORDER — LIDOCAINE HCL (CARDIAC) PF 100 MG/5ML IV SOSY
PREFILLED_SYRINGE | INTRAVENOUS | Status: DC | PRN
  Administered 2019-09-12: 30 mg via INTRAVENOUS

## 2019-09-12 MED ORDER — POVIDONE-IODINE 10 % EX SWAB: 2.0000 "application " | Freq: Once | CUTANEOUS | Status: DC

## 2019-09-12 MED ORDER — OXYCODONE HCL 5 MG PO TABS
5.0000 mg | ORAL_TABLET | Freq: Once | ORAL | Status: AC | PRN
  Administered 2019-09-12: 15:00:00 5 mg via ORAL

## 2019-09-12 MED ORDER — FENTANYL CITRATE (PF) 100 MCG/2ML IJ SOLN
INTRAMUSCULAR | Status: DC | PRN
  Administered 2019-09-12: 50 ug via INTRAVENOUS

## 2019-09-12 MED ORDER — SODIUM CHLORIDE 0.9 % IV SOLN: INTRAVENOUS | Status: DC

## 2019-09-12 MED ORDER — SCOPOLAMINE 1 MG/3DAYS TD PT72
1.0000 | MEDICATED_PATCH | TRANSDERMAL | Status: DC
  Administered 2019-09-12: 12:00:00 1.5 mg via TRANSDERMAL

## 2019-09-12 MED ORDER — MIDAZOLAM HCL 2 MG/2ML IJ SOLN
INTRAMUSCULAR | Status: AC
  Filled 2019-09-12: qty 2

## 2019-09-12 MED ORDER — ACETAMINOPHEN 160 MG/5ML PO SOLN: 325.0000 mg | ORAL | Status: DC | PRN

## 2019-09-12 MED ORDER — BUPIVACAINE HCL (PF) 0.5 % IJ SOLN
INTRAMUSCULAR | Status: AC
  Filled 2019-09-12: qty 30

## 2019-09-12 MED ORDER — ACETAMINOPHEN 325 MG PO TABS: 325.0000 mg | ORAL_TABLET | ORAL | Status: DC | PRN

## 2019-09-12 MED ORDER — LIDOCAINE-EPINEPHRINE 1 %-1:100000 IJ SOLN
INTRAMUSCULAR | Status: AC
  Filled 2019-09-12: qty 1

## 2019-09-12 MED ORDER — DEXAMETHASONE SODIUM PHOSPHATE 10 MG/ML IJ SOLN
INTRAMUSCULAR | Status: DC | PRN
  Administered 2019-09-12: 5 mg via INTRAVENOUS

## 2019-09-12 MED ORDER — BUPIVACAINE HCL 0.5 % IJ SOLN
INTRAMUSCULAR | Status: DC | PRN
  Administered 2019-09-12: 20 mL

## 2019-09-12 MED ORDER — MIDAZOLAM HCL 2 MG/2ML IJ SOLN
2.0000 mg | Freq: Once | INTRAMUSCULAR | Status: AC
  Administered 2019-09-12: 12:00:00 2 mg via INTRAVENOUS

## 2019-09-12 MED ORDER — PROPOFOL 500 MG/50ML IV EMUL
INTRAVENOUS | Status: DC | PRN
  Administered 2019-09-12: 25 ug/kg/min via INTRAVENOUS

## 2019-09-12 MED ORDER — SCOPOLAMINE 1 MG/3DAYS TD PT72
MEDICATED_PATCH | TRANSDERMAL | Status: AC
  Filled 2019-09-12: qty 1

## 2019-09-12 MED ORDER — KETOROLAC TROMETHAMINE 30 MG/ML IJ SOLN
30.0000 mg | Freq: Once | INTRAMUSCULAR | Status: AC
  Administered 2019-09-12: 15:00:00 30 mg via INTRAVENOUS

## 2019-09-12 MED ORDER — ONDANSETRON HCL 4 MG/2ML IJ SOLN: 4.0000 mg | Freq: Once | INTRAMUSCULAR | Status: DC | PRN

## 2019-09-12 MED FILL — oxyCODONE HCL 5 MG TABS: 5 | 5 days supply | Qty: 40 | Fill #0

## 2019-09-12 SURGICAL SUPPLY — 50 items
BENZOIN TINCTURE PRP APPL 2/3 (GAUZE/BANDAGES/DRESSINGS) ×2 IMPLANT
BLADE EXCALIBUR 4.0MM X 13CM (MISCELLANEOUS)
BLADE EXCALIBUR 4.0X13 (MISCELLANEOUS) IMPLANT
BLADE SURG 15 STRL LF DISP TIS (BLADE) IMPLANT
BLADE SURG 15 STRL SS (BLADE) ×2
BNDG ELASTIC 6X5.8 VLCR STR LF (GAUZE/BANDAGES/DRESSINGS) ×3 IMPLANT
BNDG GAUZE ELAST 4 BULKY (GAUZE/BANDAGES/DRESSINGS) ×3 IMPLANT
CLOSURE STERI-STRIP 1/2X4 (GAUZE/BANDAGES/DRESSINGS) ×1
CLSR STERI-STRIP ANTIMIC 1/2X4 (GAUZE/BANDAGES/DRESSINGS) ×1 IMPLANT
COVER WAND RF STERILE (DRAPES) IMPLANT
DISSECTOR  3.8MM X 13CM (MISCELLANEOUS) ×2
DISSECTOR 3.8MM X 13CM (MISCELLANEOUS) IMPLANT
DRAPE ARTHROSCOPY W/POUCH 90 (DRAPES) ×3 IMPLANT
DRAPE IMP U-DRAPE 54X76 (DRAPES) ×2 IMPLANT
DRSG EMULSION OIL 3X3 NADH (GAUZE/BANDAGES/DRESSINGS) ×1 IMPLANT
DURAPREP 26ML APPLICATOR (WOUND CARE) ×3 IMPLANT
ELECT MENISCUS 165MM 90D (ELECTRODE) IMPLANT
ELECT REM PT RETURN 9FT ADLT (ELECTROSURGICAL) ×3
ELECTRODE REM PT RTRN 9FT ADLT (ELECTROSURGICAL) IMPLANT
GLOVE BIO SURGEON STRL SZ8.5 (GLOVE) ×4 IMPLANT
GLOVE BIOGEL PI IND STRL 7.0 (GLOVE) IMPLANT
GLOVE BIOGEL PI IND STRL 8.5 (GLOVE) IMPLANT
GLOVE BIOGEL PI INDICATOR 7.0 (GLOVE) ×4
GLOVE BIOGEL PI INDICATOR 8.5 (GLOVE) ×2
GLOVE ECLIPSE 6.5 STRL STRAW (GLOVE) ×2 IMPLANT
GLOVE ECLIPSE 8.0 STRL XLNG CF (GLOVE) ×1 IMPLANT
GLOVE SURG ORTHO 8.0 STRL STRW (GLOVE) ×2 IMPLANT
GOWN STRL REUS W/ TWL LRG LVL3 (GOWN DISPOSABLE) ×1 IMPLANT
GOWN STRL REUS W/ TWL XL LVL3 (GOWN DISPOSABLE) IMPLANT
GOWN STRL REUS W/TWL 2XL LVL3 (GOWN DISPOSABLE) ×3 IMPLANT
GOWN STRL REUS W/TWL LRG LVL3 (GOWN DISPOSABLE) ×2
GOWN STRL REUS W/TWL XL LVL3 (GOWN DISPOSABLE) ×2
HOLDER KNEE FOAM BLUE (MISCELLANEOUS) ×1 IMPLANT
IMMOBILIZER KNEE 22 UNIV (SOFTGOODS) ×2 IMPLANT
IV NS IRRIG 3000ML ARTHROMATIC (IV SOLUTION) ×2 IMPLANT
KNEE WRAP E Z 3 GEL PACK (MISCELLANEOUS) ×3 IMPLANT
MANIFOLD NEPTUNE II (INSTRUMENTS) ×2 IMPLANT
PACK ARTHROSCOPY DSU (CUSTOM PROCEDURE TRAY) ×3 IMPLANT
PACK BASIN DAY SURGERY FS (CUSTOM PROCEDURE TRAY) ×3 IMPLANT
PENCIL SMOKE EVACUATOR (MISCELLANEOUS) ×2 IMPLANT
PORT APPOLLO RF 90DEGREE MULTI (SURGICAL WAND) ×1 IMPLANT
SPONGE LAP 4X18 RFD (DISPOSABLE) ×2 IMPLANT
SUT ETHILON 4 0 PS 2 18 (SUTURE) ×2 IMPLANT
SUT MNCRL AB 3-0 PS2 18 (SUTURE) ×2 IMPLANT
SUT VIC AB 0 CT3 27 (SUTURE) ×2 IMPLANT
SUT VIC AB 2-0 CT3 27 (SUTURE) ×4 IMPLANT
TOWEL GREEN STERILE FF (TOWEL DISPOSABLE) ×3 IMPLANT
TUBING ARTHROSCOPY IRRIG 16FT (MISCELLANEOUS) ×3 IMPLANT
WATER STERILE IRR 1000ML POUR (IV SOLUTION) ×1 IMPLANT
YANKAUER SUCT BULB TIP NO VENT (SUCTIONS) ×2 IMPLANT

## 2019-09-12 NOTE — Anesthesia Procedure Notes (Addendum)
Procedure Name: LMA Insertion Date/Time: 09/12/2019 12:07 PM Performed by: Signe Colt, CRNA Pre-anesthesia Checklist: Patient identified, Emergency Drugs available, Suction available and Patient being monitored Patient Re-evaluated:Patient Re-evaluated prior to induction Oxygen Delivery Method: Circle system utilized Preoxygenation: Pre-oxygenation with 100% oxygen Induction Type: IV induction Ventilation: Mask ventilation without difficulty LMA: LMA inserted LMA Size: 3.0 Number of attempts: 1 Airway Equipment and Method: Bite block Placement Confirmation: positive ETCO2 Tube secured with: Tape Dental Injury: Teeth and Oropharynx as per pre-operative assessment

## 2019-09-12 NOTE — Anesthesia Postprocedure Evaluation (Signed)
Anesthesia Post Note  Patient: Mariateresa Batra  Procedure(s) Performed: RIGHT KNEE ARTHROSCOPY WITH LATERAL MENISECTOMY, OPEN EXCISION LATERAL MENISCAL CYST (Right Knee) REMOVAL OF RIGHT LATARAL MENISCAL CYST (Right Knee)     Patient location during evaluation: PACU Anesthesia Type: General Level of consciousness: awake and alert Pain management: pain level controlled Vital Signs Assessment: post-procedure vital signs reviewed and stable Respiratory status: spontaneous breathing, nonlabored ventilation, respiratory function stable and patient connected to nasal cannula oxygen Cardiovascular status: blood pressure returned to baseline and stable Postop Assessment: no apparent nausea or vomiting Anesthetic complications: no    Last Vitals:  Vitals:   09/12/19 1155 09/12/19 1325  BP: 116/72 (!) (P) 91/46  Pulse: 100 70  Resp: 12 11  Temp:  (P) 36.6 C  SpO2: 100% 100%    Last Pain:  Vitals:   09/12/19 1053  TempSrc: Oral  PainSc: 3                  Kashauna Celmer

## 2019-09-12 NOTE — Progress Notes (Signed)
AssistedDr. Oddono with right, ultrasound guided, adductor canal block. Side rails up, monitors on throughout procedure. See vital signs in flow sheet. Tolerated Procedure well.  

## 2019-09-12 NOTE — Op Note (Signed)
NAMEARONDA, BURFORD MEDICAL RECORD [id number] ACCOUNT [id number] DATE OF BIRTH:11/15/1995 FACILITY: [facility] LOCATION: [place], [place]  OPERATIVE REPORT  DATE OF PROCEDURE:  09/12/2019  PREOPERATIVE DIAGNOSES:   1.  Possible recurrent tear lateral meniscus, right knee. 2.  Symptomatic lateral meniscal cyst.  POSTOPERATIVE DIAGNOSES:   1.  Recurrent tear anterior horn of the lateral meniscus. 2.  Lateral meniscus cyst.  PROCEDURE: 1.  Diagnostic arthroscopy right knee with partial lateral meniscectomy. 2.  Open excision of lateral meniscus cyst.  SURGEON:  Joni Fears, MD  ASSISTANT:  Biagio Borg, PA-C.  ANESTHESIA:  General laryngeal with adductor canal block.  COMPLICATIONS:  None.  HISTORY:  A 23 year old female is over a year status post right knee arthroscopy.  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  She did quite well until recently when she has had some recurrent pain along the anterolateral aspect of her  right knee.  I have repeated the MRI scan that demonstrated a persistent lateral meniscus cyst, probably symptomatic in that it is causing pressure on the IT band.  The plan is to re-scope her knee with possibility of a recurrent tear, although not  demonstrated by MRI scan and excise the cyst.  DESCRIPTION OF PROCEDURE:  The patient was met in the holding area and identified the right knee as appropriate site and marked it accordingly.  Anesthesia performed an adductor canal block.  The patient was then taken to room 5, carefully placed under general laryngeal anesthesia without difficulty.  Right lower extremity was placed in a thigh tourniquet.  The leg was then prepped with DuraPrep from the thigh tourniquet to the ankle.  Sterile draping was performed.  A timeout was called.  The 2 prior arthroscopic portals were infiltrated with 0.25% Marcaine with epinephrine.  Small stab wounds were made over the prior  operative site.  Arthroscope was placed through the medial compartment.  There was a small effusion.  The patella tracked  in the midline.  No loose bodies.  ACL and PCL appeared to be intact.  No pathology identified in the medial compartment.  In the lateral compartment, there was some tearing along the anterior horn of the meniscus where the patient was symptomatic.  The areas were frayed and there was a horizontal component to a tear in the midportion of the meniscus.  Second portal was  established.  I debrided the areas of tearing with the Cuda shaver.  The remaining meniscus appeared to be intact.  There was a subtotal resection of the anterior horn.  At that point, we redraped.  We placed the knee in extension.  I elevated the leg and Esmarch the extremity with a tourniquet at 300 mmHg.  About an inch and a half incision was then made directly over the anterior lateral joint line in line with the IT band via sharp dissection, incision carried down to subcutaneous tissue.  The IT band was identified.  It was incised along its fibers and  then elevated.  The cyst was identified.  It was debrided.  I then irrigated and closed that pants-over-vest with 3-0 Vicryl suture.  I then closed the IT band with a running 0 Vicryl.  Subcutaneous was closed with Monocryl.  Skin was closed with skin  clips.  A sterile bulky dressing was applied followed by a knee immobilizer.  PLAN:  Oxycodone for pain.  Office in 1 week.  TN/NUANCE  D:09/12/2019 T:09/12/2019 JOB:[id number]

## 2019-09-12 NOTE — Anesthesia Preprocedure Evaluation (Signed)
Anesthesia Evaluation  Patient identified by MRN, date of birth, ID band Patient awake    Reviewed: Allergy & Precautions, H&P , NPO status , Patient's Chart, lab work & pertinent test results  Airway Mallampati: II  TM Distance: >3 FB Neck ROM: Full    Dental  (+) Dental Advisory Given, Teeth Intact   Pulmonary asthma ,    Pulmonary exam normal breath sounds clear to auscultation       Cardiovascular negative cardio ROS Normal cardiovascular exam Rhythm:Regular Rate:Normal     Neuro/Psych  Headaches, Anxiety    GI/Hepatic negative GI ROS, Neg liver ROS,   Endo/Other  negative endocrine ROS  Renal/GU negative Renal ROS     Musculoskeletal negative musculoskeletal ROS (+)   Abdominal   Peds  Hematology negative hematology ROS (+)   Anesthesia Other Findings   Reproductive/Obstetrics negative OB ROS                             Anesthesia Physical  Anesthesia Plan  ASA: II  Anesthesia Plan: General   Post-op Pain Management: GA combined w/ Regional for post-op pain   Induction: Intravenous  PONV Risk Score and Plan: 4 or greater and Ondansetron, Dexamethasone, Midazolam and Treatment may vary due to age or medical condition  Airway Management Planned: LMA  Additional Equipment: None  Intra-op Plan:   Post-operative Plan: Extubation in OR  Informed Consent: I have reviewed the patients History and Physical, chart, labs and discussed the procedure including the risks, benefits and alternatives for the proposed anesthesia with the patient or authorized representative who has indicated his/her understanding and acceptance.     Dental advisory given  Plan Discussed with: CRNA, Anesthesiologist and Surgeon  Anesthesia Plan Comments: (Discussed both nerve block for pain relief post-op and GA; including NV, sore throat, dental injury, and pulmonary complications)         Anesthesia Quick Evaluation

## 2019-09-12 NOTE — Transfer of Care (Signed)
Immediate Anesthesia Transfer of Care Note  Patient: Rachel [name]  Procedure(s) Performed: RIGHT KNEE ARTHROSCOPY WITH LATERAL MENISECTOMY, OPEN EXCISION LATERAL MENISCAL CYST (Right Knee) REMOVAL OF RIGHT LATARAL MENISCAL CYST (Right Knee)  Patient Location: PACU  Anesthesia Type:GA combined with regional for post-op pain  Level of Consciousness: drowsy and patient cooperative  Airway & Oxygen Therapy: Patient Spontanous Breathing and Patient connected to face mask oxygen  Post-op Assessment: Report given to RN and Post -op Vital signs reviewed and stable  Post vital signs: Reviewed and stable  Last Vitals:  Vitals Value Taken Time  BP 91/46 09/12/19 1323  Temp    Pulse 71 09/12/19 1324  Resp 11 09/12/19 1324  SpO2 100 % 09/12/19 1324  Vitals shown include unvalidated device data.  Last Pain:  Vitals:   09/12/19 1053  TempSrc: Oral  PainSc: 3          Complications: No apparent anesthesia complications

## 2019-09-12 NOTE — Anesthesia Procedure Notes (Signed)
Anesthesia Regional Block: Adductor canal block   Pre-Anesthetic Checklist: ,, timeout performed, Correct Patient, Correct Site, Correct Laterality, Correct Procedure, Correct Position, site marked, Risks and benefits discussed,  Surgical consent,  Pre-op evaluation,  At surgeon's request and post-op pain management  Laterality: Right  Prep: chloraprep       Needles:  Injection technique: Single-shot  Needle Type: Echogenic Stimulator Needle     Needle Length: 5cm  Needle Gauge: 22     Additional Needles:   Procedures:, nerve stimulator,,, ultrasound used (permanent image in chart),,,,  Narrative:  Start time: 09/12/2019 11:50 AM End time: 09/12/2019 11:55 AM Injection made incrementally with aspirations every 5 mL.  Performed by: Personally  Anesthesiologist: Janeece Riggers, MD  Additional Notes: Functioning IV was confirmed and monitors were applied.  A 59mm 22ga Arrow echogenic stimulator needle was used. Sterile prep and drape,hand hygiene and sterile gloves were used. Ultrasound guidance: relevant anatomy identified, needle position confirmed, local anesthetic spread visualized around nerve(s)., vascular puncture avoided.  Image printed for medical record. Negative aspiration and negative test dose prior to incremental administration of local anesthetic. The patient tolerated the procedure well.

## 2019-09-12 NOTE — Discharge Instructions (Signed)
Post Anesthesia Home Care Instructions  Activity: Get plenty of rest for the remainder of the day. A responsible individual must stay with you for 24 hours following the procedure.  For the next 24 hours, DO NOT: -Drive a car -Operate machinery -Drink alcoholic beverages -Take any medication unless instructed by your physician -Make any legal decisions or sign important papers.  Meals: Start with liquid foods such as gelatin or soup. Progress to regular foods as tolerated. Avoid greasy, spicy, heavy foods. If nausea and/or vomiting occur, drink only clear liquids until the nausea and/or vomiting subsides. Call your physician if vomiting continues.  Special Instructions/Symptoms: Your throat may feel dry or sore from the anesthesia or the breathing tube placed in your throat during surgery. If this causes discomfort, gargle with warm salt water. The discomfort should disappear within 24 hours.  If you had a scopolamine patch placed behind your ear for the management of post- operative nausea and/or vomiting:  1. The medication in the patch is effective for 72 hours, after which it should be removed.  Wrap patch in a tissue and discard in the trash. Wash hands thoroughly with soap and water. 2. You may remove the patch earlier than 72 hours if you experience unpleasant side effects which may include dry mouth, dizziness or visual disturbances. 3. Avoid touching the patch. Wash your hands with soap and water after contact with the patch.      Regional Anesthesia Blocks  1. Numbness or the inability to move the "blocked" extremity may last from 3-48 hours after placement. The length of time depends on the medication injected and your individual response to the medication. If the numbness is not going away after 48 hours, call your surgeon.  2. The extremity that is blocked will need to be protected until the numbness is gone and the  Strength has returned. Because you cannot feel it, you  will need to take extra care to avoid injury. Because it may be weak, you may have difficulty moving it or using it. You may not know what position it is in without looking at it while the block is in effect.  3. For blocks in the legs and feet, returning to weight bearing and walking needs to be done carefully. You will need to wait until the numbness is entirely gone and the strength has returned. You should be able to move your leg and foot normally before you try and bear weight or walk. You will need someone to be with you when you first try to ensure you do not fall and possibly risk injury.  4. Bruising and tenderness at the needle site are common side effects and will resolve in a few days.  5. Persistent numbness or new problems with movement should be communicated to the surgeon or the [facility] ([phone])/ [facility] ([phone]).  Information for Discharge Teaching: EXPAREL (bupivacaine liposome injectable suspension)   Your surgeon or anesthesiologist gave you EXPAREL(bupivacaine) to help control your pain after surgery.   EXPAREL is a local anesthetic that provides pain relief by numbing the tissue around the surgical site.  EXPAREL is designed to release pain medication over time and can control pain for up to 72 hours.  Depending on how you respond to EXPAREL, you may require less pain medication during your recovery.  Possible side effects:  Temporary loss of sensation or ability to move in the area where bupivacaine was injected.  Nausea, vomiting, constipation  Rarely,   numbness and tingling in your mouth or lips, lightheadedness, or anxiety may occur.  Call your doctor right away if you think you may be experiencing any of these sensations, or if you have other questions regarding possible side effects.  Follow all other discharge instructions given to you by your surgeon or nurse. Eat a healthy diet and drink plenty of water or other  fluids.  If you return to the hospital for any reason within 96 hours following the administration of EXPAREL, it is important for health care providers to know that you have received this anesthetic. A teal colored band has been placed on your arm with the date, time and amount of EXPAREL you have received in order to alert and inform your health care providers. Please leave this armband in place for the full 96 hours following administration, and then you may remove the band. 

## 2019-09-12 NOTE — H&P (Signed)
The recent History & Physical has been reviewed. I have personally examined the patient today. There is no interval change to the documented History & Physical. The patient would like to proceed with the procedure.  Garald Balding 09/12/2019,  11:49 AM

## 2019-09-12 NOTE — Op Note (Signed)
PATIENT ID:      Rachel [name]  MRN:     [id number] DOB/AGE:    16-Jan-1996 / 23 y.o.       OPERATIVE REPORT    DATE OF PROCEDURE:  09/12/2019       PREOPERATIVE DIAGNOSIS:   torn lateral meniscus, lateral meniscus cyst right knee                                                       Estimated body mass index is 27.86 kg/m as calculated from the following:   Height as of this encounter: 5\' 2"  (1.575 m).   Weight as of this encounter: 69.1 kg.     POSTOPERATIVE DIAGNOSIS:   torn lateral meniscus, lateral meniscus cyst right knee                                                                     Estimated body mass index is 27.86 kg/m as calculated from the following:   Height as of this encounter: 5\' 2"  (1.575 m).   Weight as of this encounter: 69.1 kg.     PROCEDURE:  Procedure(s): RIGHT KNEE ARTHROSCOPY WITHPARTIALATERAL MENISECTOMY, OPEN EXCISION LATERAL MENISCAL CYS     SURGEON:  Joni Fears, MD    ASSISTANT:   Biagio Borg, PA-C   (Present and scrubbed throughout the case, critical for assistance with exposure, retraction, instrumentation, and closure.)          ANESTHESIA: regional and general     DRAINS: none :      TOURNIQUET TIME: * Missing tourniquet times found for documented tourniquets in log: [id number] *    COMPLICATIONS:  None   CONDITION:  stable  PROCEDURE IN [id number]   Rachel [name] 09/12/2019, 1:06 PM

## 2019-09-13 ENCOUNTER — Telehealth: Payer: Self-pay | Admitting: Orthopaedic Surgery

## 2019-09-13 ENCOUNTER — Encounter (HOSPITAL_BASED_OUTPATIENT_CLINIC_OR_DEPARTMENT_OTHER): Payer: Self-pay | Admitting: Orthopaedic Surgery

## 2019-09-13 NOTE — Telephone Encounter (Signed)
Patient left a voicemail message wanting clarification as to what she is suppose to be doing at home.  She stated that the discharge papers state something different than what she was told.  CB#[phone].  Thank you.

## 2019-09-13 NOTE — Telephone Encounter (Signed)
Please call.

## 2019-09-13 NOTE — Telephone Encounter (Signed)
Pt called in requesting a call back in regards to her discharge instructions she has some concerns. Please give her a call   [phone]

## 2019-09-13 NOTE — Telephone Encounter (Signed)
See below

## 2019-09-13 NOTE — Telephone Encounter (Signed)
called

## 2019-09-19 ENCOUNTER — Encounter: Payer: Self-pay | Admitting: Orthopedic Surgery

## 2019-09-19 ENCOUNTER — Ambulatory Visit (INDEPENDENT_AMBULATORY_CARE_PROVIDER_SITE_OTHER): Payer: No Typology Code available for payment source | Admitting: Orthopedic Surgery

## 2019-09-19 ENCOUNTER — Other Ambulatory Visit: Payer: Self-pay

## 2019-09-19 VITALS — BP 131/88 | HR 101 | Ht 62.0 in | Wt 152.0 lb

## 2019-09-19 DIAGNOSIS — M23341 Other meniscus derangements, anterior horn of lateral meniscus, right knee: Secondary | ICD-10-CM

## 2019-09-19 DIAGNOSIS — M23009 Cystic meniscus, unspecified meniscus, unspecified knee: Secondary | ICD-10-CM

## 2019-09-19 DIAGNOSIS — M23349 Other meniscus derangements, anterior horn of lateral meniscus, unspecified knee: Secondary | ICD-10-CM | POA: Insufficient documentation

## 2019-09-19 NOTE — Progress Notes (Signed)
Office Visit Note   Patient: Rachel [name]           Date of Birth: 1996-10-05           MRN: [id number] Visit Date: 09/19/2019              Requested by: Wynn Banker, MD [address] [place],  [place] [address] PCP: Wynn Banker, MD   Assessment & Plan: Visit Diagnoses:  1. Derangement of anterior horn of lateral meniscus of right knee   2. Meniscal cyst, unspecified laterality     Plan:  #1: Steri-Strips were removed and was replaced with a single strip down the center of the wound. #2: Placed her back with the knee immobilizer to protect the lateral incision as well as the closure of the cyst.  continue to walk and ambulate with her crutches until seen  Follow-Up Instructions: Return in about 1 week (around 09/26/2019).   Orders:  No orders of the defined types were placed in this encounter.  No orders of the defined types were placed in this encounter.     Procedures: No procedures performed   Clinical Data: No additional findings.   Subjective: Chief Complaint  Patient presents with  . Right Knee - Routine Post Op    Right knee scope DOS 09/12/2019    HPI: Patient presents today for a follow up on her right knee. She is now one week out from a right knee arthroscopy. Her surgery was on 09/12/2019. She said that she has pain at the incision. She is taking oxycodone as needed.      Objective: Vital Signs: BP 131/88   Pulse (!) 101   Ht 5\' 2"  (1.575 m)   Wt 152 lb (68.9 kg)   BMI 27.80 kg/m    Ortho Exam  Exam today reveals slowly healing of a arthroscopic portals as well as the lateral excision site of the cyst.  She has about 30 degrees of range of motion.  In a knee immobilizer.  No signs of infection.  Mild effusion.  Specialty Comments:  No specialty comments available.  Imaging: No results found.   PMFS History: Current Outpatient Medications  Medication Sig Dispense Refill  . ALAYCEN 1/35 tablet TAKE 1 TABLET BY  MOUTH ONCE DAILY 84 tablet 3  . Erenumab-aooe (AIMOVIG) 70 MG/ML SOAJ Inject 70 mg into the skin every 30 (thirty) days. 1 pen 3  . oxyCODONE (ROXICODONE) 5 MG immediate release tablet Take 1-2 tablets (5-10 mg total) by mouth every 6 (six) hours as needed. 40 tablet 0  . Ubrogepant (UBRELVY) 100 MG TABS Take 1 tablet by mouth as needed (May repeat dose after 2 hours.  Maximum 2 tablets in 24 hours). 16 tablet 3   No current facility-administered medications for this visit.     Patient Active Problem List   Diagnosis Date Noted  . Derangement of anterior horn of lateral meniscus 09/19/2019  . Meniscal cyst, unspecified laterality 09/12/2019  . Chronic pain of right knee 11/22/2018  . Lateral meniscus tear left 11/08/2018  . Migraine 07/06/2018  . Anxiety 07/06/2018   Past Medical History:  Diagnosis Date  . Asthma    as a child   . Headache     Family History  Problem Relation Age of Onset  . Bipolar disorder Mother   . Schizophrenia Mother        significant paranoia  . OCD Mother   . Migraines Mother   . Atrial  fibrillation Father   . High Cholesterol Father   . Migraines Sister   . CAD Maternal Grandmother   . Vascular Disease Maternal Grandmother   . Mental illness Paternal Grandmother   . Migraines Sister   . Migraines Sister   . High Cholesterol Paternal Grandfather   . Heart disease Paternal Grandfather   . Migraines Paternal Grandfather     Past Surgical History:  Procedure Laterality Date  . CYST EXCISION Right 09/12/2019   Procedure: OPEN REMOVAL OF RIGHT LATARAL MENISCAL CYST;  Surgeon: Garald Balding, MD;  Location: [place];  Service: Orthopedics;  Laterality: Right;  . KNEE ARTHROSCOPY Left 2013   meniscus repair  . KNEE ARTHROSCOPY WITH LATERAL MENISECTOMY Right 11/08/2018   Procedure: RIGHT KNEE ARTHROSCOPY WITH LATERAL MENISECTOMY, EXCISION MENISCAL CYST LATERAL;  Surgeon: Garald Balding, MD;  Location: [place];  Service: Orthopedics;  Laterality: Right;  . KNEE ARTHROSCOPY WITH LATERAL MENISECTOMY Right 09/12/2019   Procedure: RIGHT KNEE ARTHROSCOPY WITH LATERAL MENISECTOMY, OPEN EXCISION LATERAL MENISCAL CYST;  Surgeon: Garald Balding, MD;  Location: [place];  Service: Orthopedics;  Laterality: Right;   Social History   Occupational History  . Occupation: Visual merchandiser: [employer]  Tobacco Use  . Smoking status: Never Smoker  . Smokeless tobacco: Never Used  Substance and Sexual Activity  . Alcohol use: Yes    Comment: occasional  . Drug use: Never  . Sexual activity: Yes    Partners: Male    Birth control/protection: Pill

## 2019-09-26 ENCOUNTER — Other Ambulatory Visit: Payer: Self-pay

## 2019-09-26 ENCOUNTER — Ambulatory Visit: Payer: No Typology Code available for payment source | Admitting: Neurology

## 2019-09-26 ENCOUNTER — Encounter: Payer: Self-pay | Admitting: Orthopaedic Surgery

## 2019-09-26 ENCOUNTER — Ambulatory Visit (INDEPENDENT_AMBULATORY_CARE_PROVIDER_SITE_OTHER): Payer: No Typology Code available for payment source | Admitting: Orthopaedic Surgery

## 2019-09-26 VITALS — Ht 62.0 in | Wt 152.0 lb

## 2019-09-26 DIAGNOSIS — G8929 Other chronic pain: Secondary | ICD-10-CM

## 2019-09-26 DIAGNOSIS — M23341 Other meniscus derangements, anterior horn of lateral meniscus, right knee: Secondary | ICD-10-CM

## 2019-09-26 DIAGNOSIS — M25561 Pain in right knee: Secondary | ICD-10-CM

## 2019-09-26 NOTE — Progress Notes (Signed)
Office Visit Note   Patient: Rachel [name]           Date of Birth: [birth year]           MRN: [id number] Visit Date: 09/26/2019              Requested by: Wynn Banker, MD [address] [place],  [place] [address] PCP: Wynn Banker, MD   Assessment & Plan: Visit Diagnoses:  1. Chronic pain of right knee   2. Derangement of anterior horn of lateral meniscus of right knee     Plan: 2-week status post repeat right knee arthroscopy.  There was a complex tear of the anterior horn of the lateral meniscus with a recurrent lateral meniscal cyst.  The anterior horn of the meniscus was debrided back to normal rim and I did an open resection of the cyst.  She has been in a knee immobilizer with the knee extended without any problems.  No fever or chills.  The wound is healing nicely.  She will begin range of motion exercises with weightbearing as tolerated.  No work.  Return in 2 weeks  Follow-Up Instructions: Return in about 2 weeks (around 10/10/2019).   Orders:  No orders of the defined types were placed in this encounter.  No orders of the defined types were placed in this encounter.     Procedures: No procedures performed   Clinical Data: No additional findings.   Subjective: Chief Complaint  Patient presents with  . Right Knee - Routine Post Op    Right knee scope DOS 09/12/2019  Patient presents today for follow up on her right knee. She had a right knee arthroscopy on 09/12/2019. She is now 2 weeks out from surgery. She has been wearing a knee immobilizer. She said that most of her pain is located medially. She takes oxycodone if needed. Her swelling has improved.   HPI  Review of Systems  Constitutional: Negative for fatigue.  HENT: Negative for ear pain.   Eyes: Negative for pain.  Respiratory: Negative for shortness of breath.   Cardiovascular: Negative for leg swelling.  Gastrointestinal: Negative for constipation and diarrhea.  Endocrine:  Negative for cold intolerance and heat intolerance.  Genitourinary: Negative for difficulty urinating.  Musculoskeletal: Positive for joint swelling.  Skin: Positive for rash.  Allergic/Immunologic: Negative for food allergies.  Neurological: Negative for weakness.  Hematological: Does not bruise/bleed easily.  Psychiatric/Behavioral: Positive for sleep disturbance.     Objective: Vital Signs: Ht 5\' 2"  (1.575 m)   Wt 152 lb (68.9 kg)   BMI 27.80 kg/m   Physical Exam  Ortho Exam right knee incisions healing without problem.  No effusion.  No redness.  No drainage.  No localized areas of tenderness.  Did not check range of motion as her knee has been extended for 2 weeks no calf pain.  Neurologically intact  Specialty Comments:  No specialty comments available.  Imaging: No results found.   PMFS History: Patient Active Problem List   Diagnosis Date Noted  . Derangement of anterior horn of lateral meniscus 09/19/2019  . Meniscal cyst, unspecified laterality 09/12/2019  . Chronic pain of right knee 11/22/2018  . Lateral meniscus tear left 11/08/2018  . Migraine 07/06/2018  . Anxiety 07/06/2018   Past Medical History:  Diagnosis Date  . Asthma    as a child   . Headache     Family History  Problem Relation Age of Onset  . Bipolar disorder Mother   .  Schizophrenia Mother        significant paranoia  . OCD Mother   . Migraines Mother   . Atrial fibrillation Father   . High Cholesterol Father   . Migraines Sister   . CAD Maternal Grandmother   . Vascular Disease Maternal Grandmother   . Mental illness Paternal Grandmother   . Migraines Sister   . Migraines Sister   . High Cholesterol Paternal Grandfather   . Heart disease Paternal Grandfather   . Migraines Paternal Grandfather     Past Surgical History:  Procedure Laterality Date  . CYST EXCISION Right 09/12/2019   Procedure: OPEN REMOVAL OF RIGHT LATARAL MENISCAL CYST;  Surgeon: Garald Balding, MD;   Location: [place];  Service: Orthopedics;  Laterality: Right;  . KNEE ARTHROSCOPY Left 2013   meniscus repair  . KNEE ARTHROSCOPY WITH LATERAL MENISECTOMY Right 11/08/2018   Procedure: RIGHT KNEE ARTHROSCOPY WITH LATERAL MENISECTOMY, EXCISION MENISCAL CYST LATERAL;  Surgeon: Garald Balding, MD;  Location: [place];  Service: Orthopedics;  Laterality: Right;  . KNEE ARTHROSCOPY WITH LATERAL MENISECTOMY Right 09/12/2019   Procedure: RIGHT KNEE ARTHROSCOPY WITH LATERAL MENISECTOMY, OPEN EXCISION LATERAL MENISCAL CYST;  Surgeon: Garald Balding, MD;  Location: [place];  Service: Orthopedics;  Laterality: Right;   Social History   Occupational History  . Occupation: Visual merchandiser: [employer]  Tobacco Use  . Smoking status: Never Smoker  . Smokeless tobacco: Never Used  Substance and Sexual Activity  . Alcohol use: Yes    Comment: occasional  . Drug use: Never  . Sexual activity: Yes    Partners: Male    Birth control/protection: Pill

## 2019-09-27 NOTE — Progress Notes (Signed)
Received hard fax from pharmacy requesting PA on Rx  Submitted PA via covermymeds  Message from [place] is not required for this medication dosage form and strength at the quantity and days supply requested.  Pt has approval on file

## 2019-09-29 NOTE — Progress Notes (Signed)
Virtual Visit via Video Note The purpose of this virtual visit is to provide medical care while limiting exposure to the novel coronavirus.    Consent was obtained for video visit:  Yes.   Answered questions that patient had about telehealth interaction:  Yes.   I discussed the limitations, risks, security and privacy concerns of performing an evaluation and management service by telemedicine. I also discussed with the patient that there may be a patient responsible charge related to this service. The patient expressed understanding and agreed to proceed.  Pt location: Home Physician Location: office Name of referring provider:  Caren Macadam, MD I connected with Rachel [name] at patients initiation/request on 10/02/2019 at  9:50 AM EST by video enabled telemedicine application and verified that I am speaking with the correct person using two identifiers. Pt MRN:  [id number] Pt DOB:  02-Oct-1996 Video Participants:  Rachel [name]   History of Present Illness:  Rachel [name] is a 23 year old female who follows up for migraines.  UPDATE: Started Aimovig.  Stopped all OTC analgesics.  Prescribed Ubrelvy for abortive therapy.  About a week or two after each injection, she notes a "brain fog" lasting 24 hours.  There may be a slight headache but nothing debilitating.  Headaches were well controlled after starting Aimovig, occurring 5 times a month (3 migraines), lasting 4 hours (2-4/10 headache, 9-10/10 migraine).  However, she had knee surgery earlier this month and stopped taking birth control.  So she has had an increased in migraines and started waking up with them.  She had them daily for about a week.   Intensity:  2-4/10, 9-10/10 migraine Duration:  4 hours Frequency:  5 a month (3 are migraines) Frequency of abortive medication: 5 days a month Rescue therapy:  Ubrelvy Current NSAIDS:  none Current analgesics:  oxycodone (post-op knee surgery) Current triptans:  none Current  ergotamine:  none Current anti-emetic:  none Current muscle relaxants:  none Current anti-anxiolytic:  none Current sleep aide:  none  Current Antihypertensive medications:  none Current Antidepressant medications:  Sertraline 75mg  Current Anticonvulsant medications:  none Current anti-CGRP:  Aimovig 70mg  every 30 days; Ubrelvy 100mg  Current Vitamins/Herbal/Supplements:  none Current Antihistamines/Decongestants:  none Other therapy:  none Hormone/birth control:  progesterone  Caffeine:  1 cup of coffee daily.  1 or 2 cans of cola a week.  Sometimes tea Diet:  64 oz water daily.  Trying whole plant-based diet (notes improvement).  Limits processed foods.  Red meat, chicken or fish maybe once a week. Exercise:  Trying to, needs to accommodate for torn meniscus Depression:  yes; Anxiety:  Yes (school and work) Other pain:  Knee pain Sleep hygiene:  Poor  Wakes up often with headache  HISTORY: Onset: Since childhood Location:  Bifrontal/temporal.  If sleeping, it occurs on side she is sleeping on. Quality:  Headaches are pressure/throbbing headache, pounding if severe migraine. Initial intensity:  Headaches are 2-5/10; migraines 8-10/10.  She denies new headache, thunderclap headache or severe headache that wakes her from sleep. Aura:  no Associated symptoms: Photophobia, nausea, motion sickness, vomiting, phonophobia, osmophobia, blurred vision/black spots.  She denies associated unilateral numbness or weakness. Initial duration:  1 to 2 days Initial Frequency:  Near daily headache days Initial Frequency of abortive medication: takes analgesics/NSAIDs daily Triggers: Chocolate (aggravates), smells of essential oils, inactivity Relieving factors: Sleep in dark and quiet room, pressure on head Activity:  aggravates  She wakes up every morning with headache and will progress  to migraine.  Longest she had a migraine was 6 weeks.  MRI of brain without contrast from 12/15/18 was  normal.  Past NSAIDS:  Aleve; ibuprofen Past analgesics:  Excedrin Migraine, Excedrin Extra-Strength; Tylenol Past abortive triptans:  Rizatriptan; Sumatriptan 100mg  (causes throat tightness and nausea) Past abortive ergotamine:  none Past muscle relaxants:  Flexeril Past anti-emetic:  none Past antihypertensive medications:  Propranolol ER 80mg  (lightheadedness) Past antidepressant medications:  Venlafaxine XR 75mg  (increased headaches), Cymbalta Past anticonvulsant medications:  Topiramate 75mg  to 100mg   Past anti-CGRP:  none Past vitamins/Herbal/Supplements:  riboflavin Past antihistamines/decongestants:  none Other past therapies:  None, Toradol shot (made headache worse)  Family history of headache:  Dad, sisters, mom, maternal aunt, maternal grandmother all had migraines.  Her mom had a recurrent brain tumor.  Her aunt had colloid cyst.   Past Medical History: Past Medical History:  Diagnosis Date  . Asthma    as a child   . Headache     Medications: Outpatient Encounter Medications as of 10/02/2019  Medication Sig  . ALAYCEN 1/35 tablet TAKE 1 TABLET BY MOUTH ONCE DAILY  . Erenumab-aooe (AIMOVIG) 70 MG/ML SOAJ Inject 70 mg into the skin every 30 (thirty) days.  oxyCODONE (ROXICODONE) 5 MG immediate release tablet Take 1-2 tablets (5-10 mg total) by mouth every 6 (six) hours as needed.  Ubrogepant (UBRELVY) 100 MG TABS Take 1 tablet by mouth as needed (May repeat dose after 2 hours.  Maximum 2 tablets in 24 hours).   No facility-administered encounter medications on file as of 10/02/2019.     Allergies: Allergies  Allergen Reactions  . Sulfur Hives  . Venlafaxine     Migraine, hot flashes    Family History: Family History  Problem Relation Age of Onset  . Bipolar disorder Mother   . Schizophrenia Mother        significant paranoia  . OCD Mother   . Migraines Mother   . Atrial fibrillation Father   . High Cholesterol Father   . Migraines Sister   . CAD  Maternal Grandmother   . Vascular Disease Maternal Grandmother   . Mental illness Paternal Grandmother   . Migraines Sister   . Migraines Sister   . High Cholesterol Paternal Grandfather   . Heart disease Paternal Grandfather   . Migraines Paternal Grandfather     Social History: Social History   Socioeconomic History  . Marital status: Single    Spouse name: Not on file  . Number of children: Not on file  . Years of education: Not on file  . Highest education level: Associate degree: academic program  Occupational History  . Occupation: 2/35: [employer]  Social Needs  . Financial resource strain: Not on file  . Food insecurity    Worry: Not on file    Inability: Not on file  . Transportation needs    Medical: Not on file    Non-medical: Not on file  Tobacco Use  . Smoking status: Never Smoker  . Smokeless tobacco: Never Used  Substance and Sexual Activity  . Alcohol use: Yes    Comment: occasional  . Drug use: Never  . Sexual activity: Yes    Partners: Male    Birth control/protection: Pill  Lifestyle  . Physical activity    Days per week: Not on file    Minutes per session: Not on file  . Stress: Not on file  Relationships  . Social connections  Talks on phone: Not on file    Gets together: Not on file    Attends religious service: Not on file    Active member of club or organization: Not on file    Attends meetings of clubs or organizations: Not on file    Relationship status: Not on file  . Intimate partner violence    Fear of current or ex partner: Not on file    Emotionally abused: Not on file    Physically abused: Not on file    Forced sexual activity: Not on file  Other Topics Concern  . Not on file  Social History Narrative   Patient is right-handed. She lives with her boyfriend in a two level home. She drinks 1 cup of coffee a day, 1-2 cans of soda a week to help with studying. She is still healing from knee surgery and  unable to exercise, though walks her dog daily.    Observations/Objective:   Height 5\' 2"  (1.575 m), weight 150 lb (68 kg). No acute distress.  Alert and oriented.  Speech fluent and not dysarthric.  Language intact.  Eyes orthophoric on primary gaze.  Face symmetric.  Assessment and Plan:   Migraine without aura, without status migrainosus, not intractable.  Improved.  However, increased over past month due to recent surgery and discontinuation of birth control.  The "brain fog" may be symptom of migraine rather than side effect of Aimovig.  1.  For preventative management, increase Aimovig to 140 mg every 30 days. 2.  For abortive therapy, Ubrelvy 100 mg 3.  Limit use of pain relievers to no more than 2 days out of week to prevent risk of rebound or medication-overuse headache. 4.  Keep headache diary 5.  Exercise, hydration, caffeine cessation, sleep hygiene, monitor for and avoid triggers 6.  Consider:  magnesium citrate 400mg  daily, riboflavin 400mg  daily, and coenzyme Q10 100mg  three times daily 7. Follow up 4 months   Follow Up Instructions:    -I discussed the assessment and treatment plan with the patient. The patient was provided an opportunity to ask questions and all were answered. The patient agreed with the plan and demonstrated an understanding of the instructions.   The patient was advised to call back or seek an in-person evaluation if the symptoms worsen or if the condition fails to improve as anticipated.   Cira ServantAdam Robert , DO

## 2019-09-30 MED FILL — AIMOVIG 70 MG/ML SOAJ: 70 | 30 days supply | Qty: 1 | Fill #3

## 2019-10-02 ENCOUNTER — Other Ambulatory Visit: Payer: Self-pay

## 2019-10-02 ENCOUNTER — Telehealth (INDEPENDENT_AMBULATORY_CARE_PROVIDER_SITE_OTHER): Payer: No Typology Code available for payment source | Admitting: Neurology

## 2019-10-02 ENCOUNTER — Encounter: Payer: Self-pay | Admitting: Neurology

## 2019-10-02 VITALS — Ht 62.0 in | Wt 150.0 lb

## 2019-10-02 DIAGNOSIS — G43009 Migraine without aura, not intractable, without status migrainosus: Secondary | ICD-10-CM | POA: Diagnosis not present

## 2019-10-02 MED ORDER — AIMOVIG 140 MG/ML ~~LOC~~ SOAJ: 140.0000 mg | SUBCUTANEOUS | 11 refills | Status: AC

## 2019-10-02 NOTE — Progress Notes (Signed)
Key: [id number]) Aimovig 140MG /ML auto-injectors  Wait for Determination Please wait for [employer] to return a determination.

## 2019-10-03 ENCOUNTER — Encounter: Payer: Self-pay | Admitting: Orthopaedic Surgery

## 2019-10-03 ENCOUNTER — Telehealth: Payer: Self-pay | Admitting: Orthopaedic Surgery

## 2019-10-03 NOTE — Telephone Encounter (Signed)
Patient called in requesting a call back from Dr. Rudene Anda nurse. Patient stated need work note for extended time off. Patient phone number is  [phone].

## 2019-10-03 NOTE — Telephone Encounter (Signed)
Spoke with patient. She will return to work at full duty on 10/23/2019. 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

## 2019-10-03 NOTE — Telephone Encounter (Signed)
Are you okay with this?

## 2019-10-03 NOTE — Telephone Encounter (Signed)
yes

## 2019-10-05 ENCOUNTER — Telehealth: Payer: Self-pay

## 2019-10-05 NOTE — Telephone Encounter (Signed)
Please advise 

## 2019-10-05 NOTE — Telephone Encounter (Signed)
Have her come in to be seen today..If these abnormal clinical findings persist, appropriate workup will be completed. The patient understands that follow up is required to elucidate the situation. If possible 12;45

## 2019-10-05 NOTE — Telephone Encounter (Signed)
Spoke with patient. She said that she has an internal blue stitch that looks like it is trying to surface. It is not painful, swollen, or draining. She is going to keep an eye on it and see if it works its way out on its on. She will keep [place] posted.

## 2019-10-05 NOTE — Telephone Encounter (Signed)
Patient called stating that she was checking her wound and noticed a stitch internally.  Would like to know if stitch needs to be removed?  Cb# [phone].  Please advise.  Thank you.

## 2019-10-06 ENCOUNTER — Other Ambulatory Visit: Payer: Self-pay

## 2019-10-06 ENCOUNTER — Ambulatory Visit (INDEPENDENT_AMBULATORY_CARE_PROVIDER_SITE_OTHER): Payer: No Typology Code available for payment source | Admitting: Orthopaedic Surgery

## 2019-10-06 ENCOUNTER — Encounter: Payer: Self-pay | Admitting: Orthopaedic Surgery

## 2019-10-06 VITALS — BP 119/89 | HR 80 | Ht 62.0 in | Wt 150.0 lb

## 2019-10-06 DIAGNOSIS — S83271D Complex tear of lateral meniscus, current injury, right knee, subsequent encounter: Secondary | ICD-10-CM

## 2019-10-06 NOTE — Progress Notes (Signed)
Aimovig approved 10/02/2019-03/31/2020.

## 2019-10-06 NOTE — Progress Notes (Signed)
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  She noticed possibly a suture sticking out.  She had buried sutures interrupted.  Slight dimpling subcutaneous tissue with some thin skin.  I do not see any definite suture present there is a small knob that sitting in the proximal portion slightly anterior but no suture is apparent.  Op note notes that the IT band was closed 0 Vicryl and 3-0 Vicryl more superficial.  Subcu closed with Monocryl.  She can follow-up with Dr. Durward Fortes as scheduled.

## 2019-10-10 ENCOUNTER — Encounter: Payer: Self-pay | Admitting: Orthopaedic Surgery

## 2019-10-10 ENCOUNTER — Other Ambulatory Visit: Payer: Self-pay

## 2019-10-10 ENCOUNTER — Ambulatory Visit (INDEPENDENT_AMBULATORY_CARE_PROVIDER_SITE_OTHER): Payer: No Typology Code available for payment source | Admitting: Orthopaedic Surgery

## 2019-10-10 VITALS — Ht 62.0 in | Wt 150.0 lb

## 2019-10-10 DIAGNOSIS — S83271D Complex tear of lateral meniscus, current injury, right knee, subsequent encounter: Secondary | ICD-10-CM

## 2019-10-10 DIAGNOSIS — M23009 Cystic meniscus, unspecified meniscus, unspecified knee: Secondary | ICD-10-CM

## 2019-10-10 DIAGNOSIS — M23341 Other meniscus derangements, anterior horn of lateral meniscus, right knee: Secondary | ICD-10-CM

## 2019-10-10 MED FILL — ALYACEN 1-35-28 TABLET: 1-35 | 84 days supply | Qty: 84 | Fill #1

## 2019-10-10 NOTE — Progress Notes (Signed)
Office Visit Note   Patient: Rachel [name]           Date of Birth: 11/15/1995           MRN: [id number] Visit Date: 10/10/2019              Requested by: Caren Macadam, MD  [place],  [place] [address] PCP: Caren Macadam, MD   Assessment & Plan: Visit Diagnoses:  1. Meniscal cyst, unspecified laterality   2. Complex tear of lateral meniscus of right knee as current injury, subsequent encounter   3. Derangement of anterior horn of lateral meniscus of right knee     Plan:  #1: Continue exercises. #2: She may try Voltaren gel instead of taking a lot of ibuprofen #3: Increase activity slowly.   Follow-Up Instructions: Return in about 4 weeks (around 11/07/2019).   Orders:  No orders of the defined types were placed in this encounter.  No orders of the defined types were placed in this encounter.     Procedures: No procedures performed   Clinical Data: No additional findings.   Subjective: Chief Complaint  Patient presents with  . Right Knee - Follow-up    Right knee arthroscopy DOS 09/12/2019  Patient presents today for follow up on her right knee. She is now 4 weeks out from right knee arthroscopy. Her mobility is improving, but states that it still feels stiff. Her pain is usually located medially, along with some numbness.  She walks her dog a mile each day. She takes Ibuprofen if needed.   HPI  Review of Systems   Objective: Vital Signs: Ht 5\' 2"  (1.575 m)   Wt 150 lb (68 kg)   BMI 27.44 kg/m   Physical Exam Constitutional:      Appearance: She is well-developed.  Eyes:     Pupils: Pupils are equal, round, and reactive to light.  Pulmonary:     Effort: Pulmonary effort is normal.  Skin:    General: Skin is warm and dry.  Neurological:     Mental Status: She is alert and oriented to person, place, and time.  Psychiatric:        Behavior: Behavior normal.     Ortho Exam  Exam today reveals the wounds to be  healing per primam with no signs of infection.  Range of motion from full extension to 115 degrees.  Ligamentously stable.  Trace effusion.  Specialty Comments:  No specialty comments available.  Imaging: No results found.   PMFS History: Current Outpatient Medications  Medication Sig Dispense Refill  . ALAYCEN 1/35 tablet TAKE 1 TABLET BY MOUTH ONCE DAILY 84 tablet 3  . Erenumab-aooe (AIMOVIG) 140 MG/ML SOAJ Inject 140 mg into the skin every 30 (thirty) days. 1 pen 11  . Ubrogepant (UBRELVY) 100 MG TABS Take 1 tablet by mouth as needed (May repeat dose after 2 hours.  Maximum 2 tablets in 24 hours). 16 tablet 3  . oxyCODONE (ROXICODONE) 5 MG immediate release tablet Take 1-2 tablets (5-10 mg total) by mouth every 6 (six) hours as needed. (Patient not taking: Reported on 10/10/2019) 40 tablet 0   No current facility-administered medications for this visit.    Patient Active Problem List   Diagnosis Date Noted  . Meniscal cyst, unspecified laterality 09/12/2019  . Chronic pain of right knee 11/22/2018  . Lateral meniscus tear left 11/08/2018  . Migraine 07/06/2018  . Anxiety 07/06/2018   Past Medical History:  Diagnosis Date  .  Asthma    as a child   . Headache     Family History  Problem Relation Age of Onset  . Bipolar disorder Mother   . Schizophrenia Mother        significant paranoia  . OCD Mother   . Migraines Mother   . Atrial fibrillation Father   . High Cholesterol Father   . Migraines Sister   . CAD Maternal Grandmother   . Vascular Disease Maternal Grandmother   . Mental illness Paternal Grandmother   . Migraines Sister   . Migraines Sister   . High Cholesterol Paternal Grandfather   . Heart disease Paternal Grandfather   . Migraines Paternal Grandfather     Past Surgical History:  Procedure Laterality Date  . CYST EXCISION Right 09/12/2019   Procedure: OPEN REMOVAL OF RIGHT LATARAL MENISCAL CYST;  Surgeon: [name], MD;  Location: [facility]  ;  Service: Orthopedics;  Laterality: Right;  . KNEE ARTHROSCOPY Left 2013   meniscus repair  . KNEE ARTHROSCOPY WITH LATERAL MENISECTOMY Right 11/08/2018   Procedure: RIGHT KNEE ARTHROSCOPY WITH LATERAL MENISECTOMY, EXCISION MENISCAL CYST LATERAL;  Surgeon: [name], MD;  Location: [facility];  Service: Orthopedics;  Laterality: Right;  . KNEE ARTHROSCOPY WITH LATERAL MENISECTOMY Right 09/12/2019   Procedure: RIGHT KNEE ARTHROSCOPY WITH LATERAL MENISECTOMY, OPEN EXCISION LATERAL MENISCAL CYST;  Surgeon: [name], MD;  Location: [facility];  Service: Orthopedics;  Laterality: Right;   Social History   Occupational History  . Occupation: Engineer, maintenance (IT): [employer]  Tobacco Use  . Smoking status: Never Smoker  . Smokeless tobacco: Never Used  Substance and Sexual Activity  . Alcohol use: Yes    Comment: occasional  . Drug use: Never  . Sexual activity: Yes    Partners: Male    Birth control/protection: Pill

## 2019-10-12 ENCOUNTER — Other Ambulatory Visit: Payer: Self-pay | Admitting: Neurology

## 2019-10-12 MED ORDER — UBRELVY 100 MG PO TABS: 1.0000 | ORAL_TABLET | ORAL | 3 refills | Status: AC | PRN

## 2019-10-12 NOTE — Telephone Encounter (Signed)
SEE scan document Rx is approved effective 07/08/19 to 01/04/2020 the request is approved for up to 16 tablets for 30 days  Will resend Rx to pharmacy to run and refill.

## 2019-10-12 NOTE — Telephone Encounter (Signed)
Received hard fax from pharmacy regarding this Rx.  Note from pharmacy: HOLD UNTIL 10/12/19 to mail out of town for holidays  No co pay card will not pay if insurance shipped ?  Rx was sent to pharmacy on 08/15/19 #16 3 refills  Called spoke with pharmacy regarding hard fax note she states that co pay card will no longer allow patient to use for Rx. Requesting another PA.  PA submitted via covermymeds  Message from [place] is not required for this medication dosage form and strength at the quantity and days supply requested.  Pt has approval on file   Informed pharmacy of this. They are requesting office call plan for update.  [phone]

## 2019-10-12 NOTE — Progress Notes (Signed)
Received hard fax from [place] Rx approved from 07/08/19 to 01/04/2020 for up to 16 tablets per 30 days

## 2019-10-12 NOTE — Telephone Encounter (Signed)
Refill on the Ubrelvy -pharm on file is correct. Patient said that pharm had already sent a request last week but hasn't heard anything back yet. Thanks!

## 2019-10-23 MED FILL — UBRELVY 100 MG TABS: 100 | 30 days supply | Qty: 16 | Fill #0

## 2019-11-02 ENCOUNTER — Telehealth: Payer: Self-pay | Admitting: Orthopaedic Surgery

## 2019-11-02 NOTE — Telephone Encounter (Signed)
Please call. Patient now lives in [place].

## 2019-11-02 NOTE — Telephone Encounter (Signed)
Patient called. She has moved. Would like to speak with someone about her knee. Her call back number is [phone]

## 2019-11-07 ENCOUNTER — Ambulatory Visit: Payer: No Typology Code available for payment source | Admitting: Orthopaedic Surgery

## 2019-12-25 ENCOUNTER — Ambulatory Visit: Payer: No Typology Code available for payment source | Admitting: Family Medicine

## 2020-02-21 ENCOUNTER — Telehealth: Payer: No Typology Code available for payment source | Admitting: Neurology

## 2020-08-30 ENCOUNTER — Other Ambulatory Visit: Payer: Self-pay | Admitting: Orthopaedic Surgery

## 2020-08-30 DIAGNOSIS — Q686 Discoid meniscus: Secondary | ICD-10-CM

## 2020-08-30 DIAGNOSIS — G8929 Other chronic pain: Secondary | ICD-10-CM

## 2020-09-25 IMAGING — MR MR HEAD W/O CM
9 of 10 series · 40 of 48 positions shown · non-contrast
Comparison: None.

CLINICAL DATA: 22 y/o  F; history of chronic headaches.

EXAM:
MRI HEAD WITHOUT CONTRAST
TECHNIQUE: Multiplanar, multiecho pulse sequences of the brain and surrounding
structures were obtained without intravenous contrast.

[Series 3: T1 · sagittal · 5.0mm · 0.47mm/px · 2 of 24 slices shown]
[im 1/24]
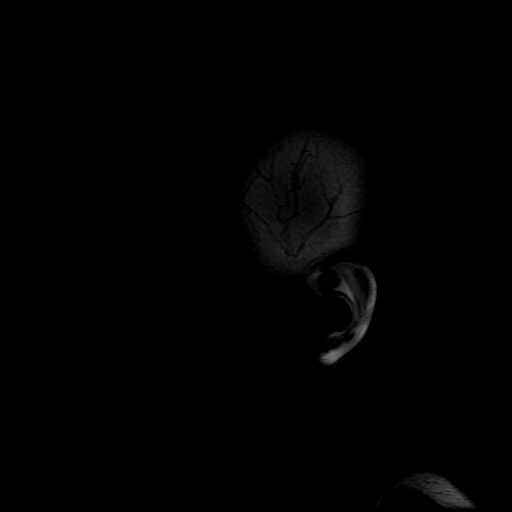
[im 24/24]
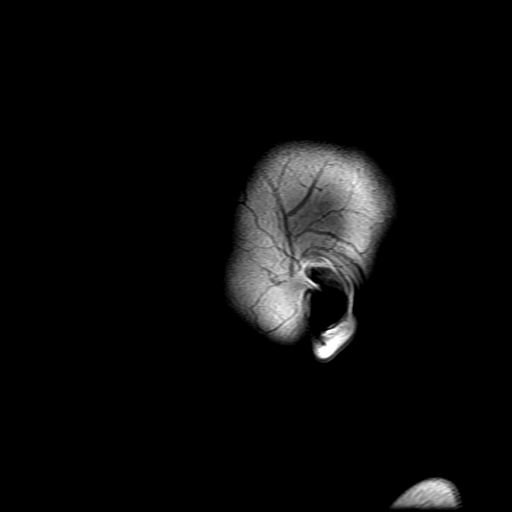

[Series 4: DWI · axial · 3.0mm · 1.09mm/px · z∈[-79,+54]mm · 8 of 94 slices shown (1 of 4)]
[im 1/94]
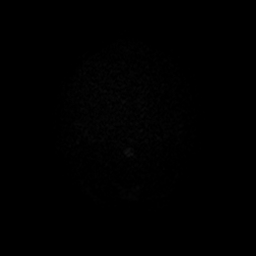
[im 11/94]
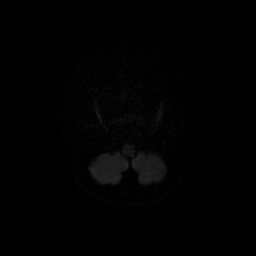
[im 32/94]
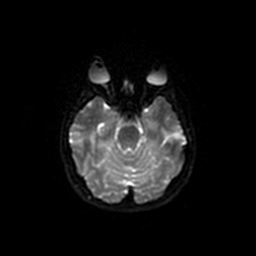
[im 42/94]
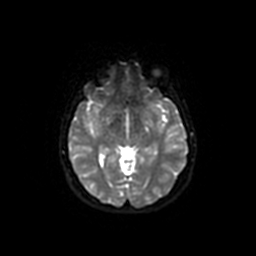
[im 52/94]
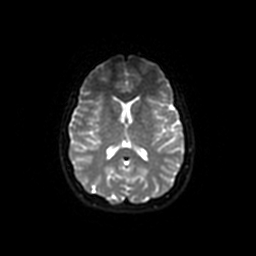
[im 63/94]
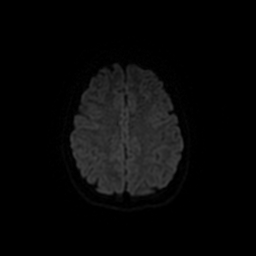
[im 83/94]
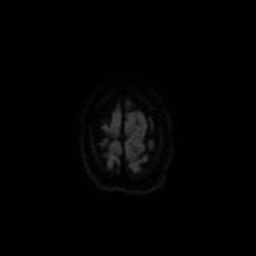
[im 94/94]
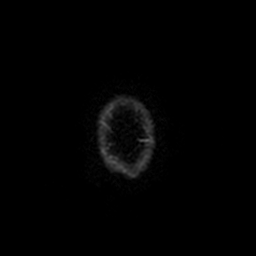

[Series 5: DWI · coronal · 3.0mm · 1.09mm/px · 11 of 108 slices shown (2 of 4)]
[im 1/108]
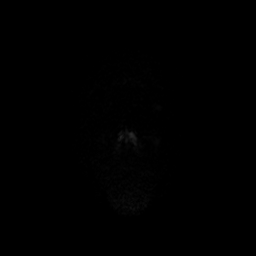
[im 11/108]
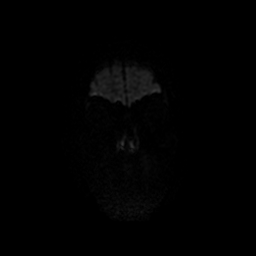
[im 22/108]
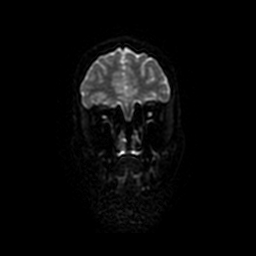
[im 33/108]
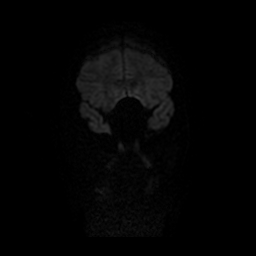
[im 43/108]
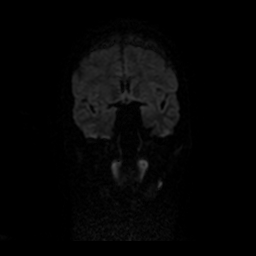
[im 54/108]
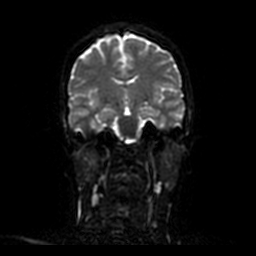
[im 65/108]
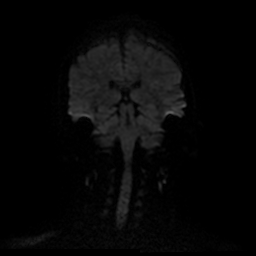
[im 75/108]
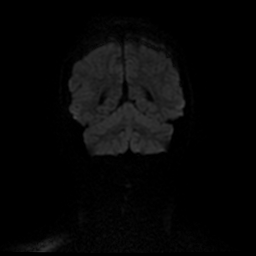
[im 86/108]
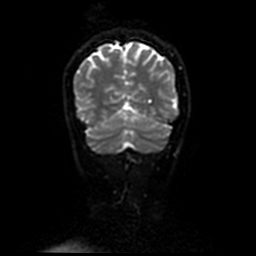
[im 97/108]
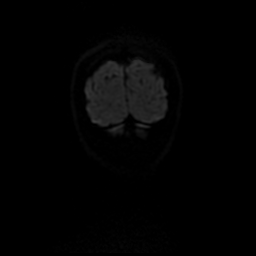
[im 108/108]
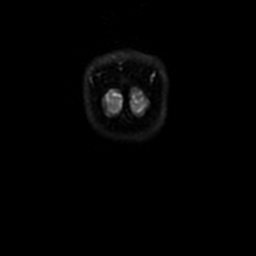

[Series 6: T2 · axial · 5.0mm · 0.43mm/px · z∈[-71,+62]mm · 2 of 24 slices shown (1 of 2)]
[im 1/24]
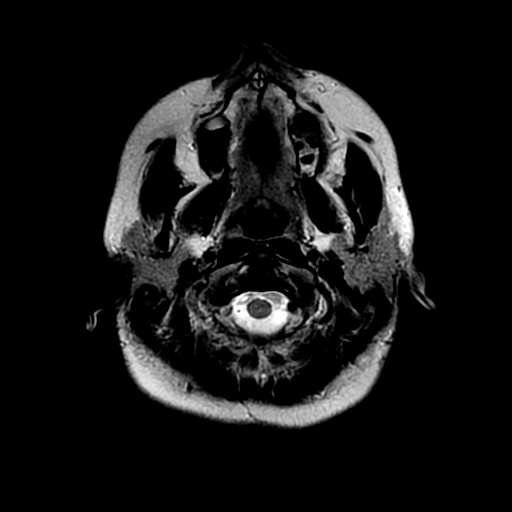
[im 24/24]
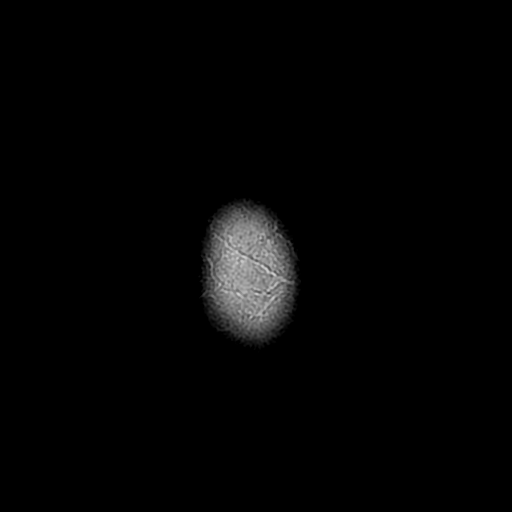

[Series 7: FLAIR · axial · 3.0mm · 0.43mm/px · z∈[-71,+62]mm · 2 of 24 slices shown]
[im 1/24]
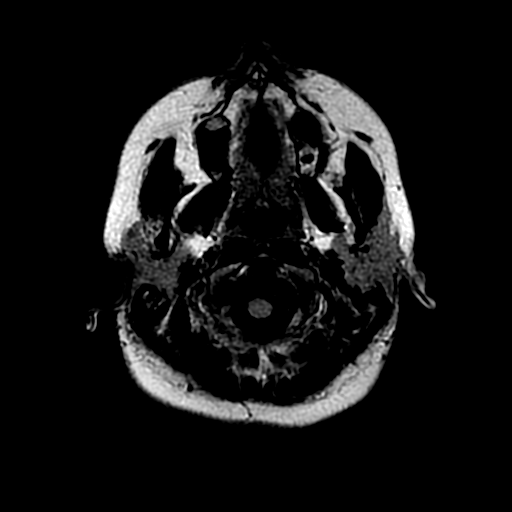
[im 24/24]
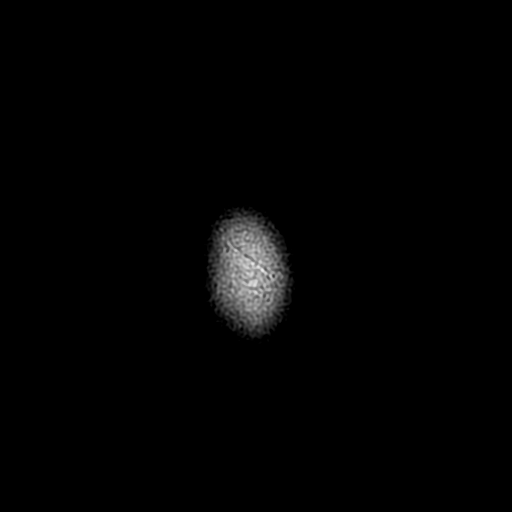

[Series 8: ax mpgr · axial · 5.0mm · 0.43mm/px · 1 of 21 slices shown]
[im 1/21]
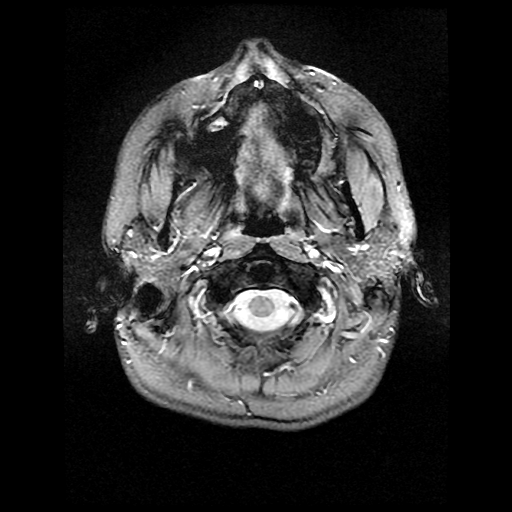

[Series 10: T2 · coronal · 5.0mm · 0.45mm/px · 3 of 26 slices shown (2 of 2)]
[im 1/26]
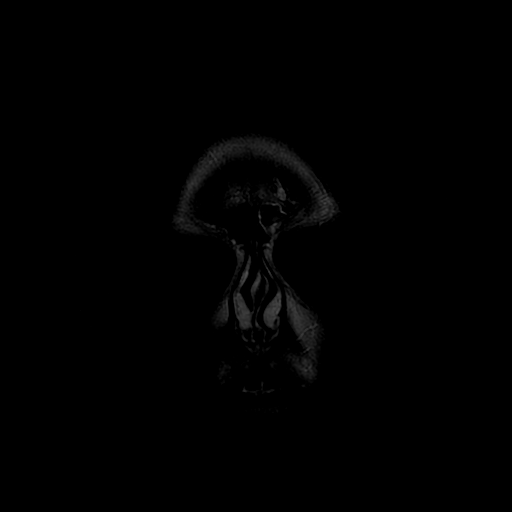
[im 13/26]
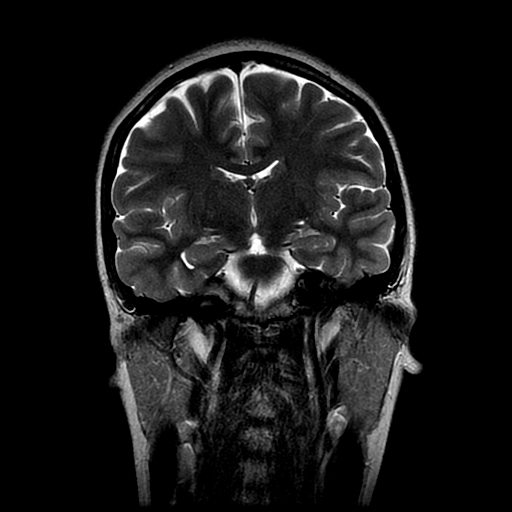
[im 26/26]
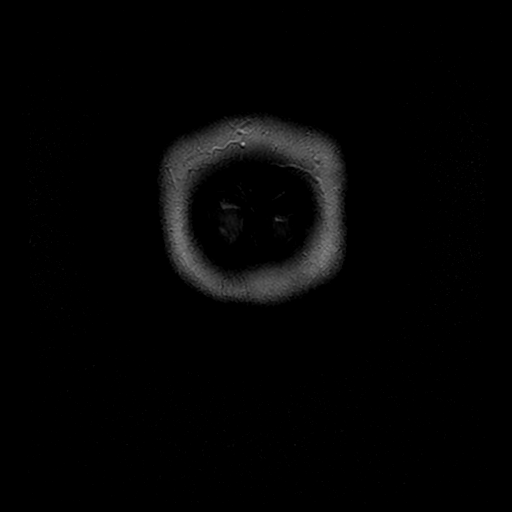

[Series 400: DWI · axial · 3.0mm · 1.09mm/px · z∈[-79,+54]mm · 5 of 47 slices shown (3 of 4)]
[im 1/47]
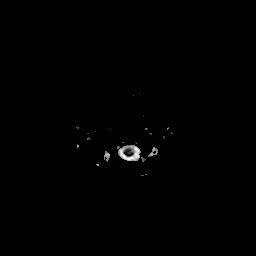
[im 12/47]
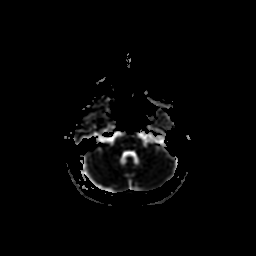
[im 24/47]
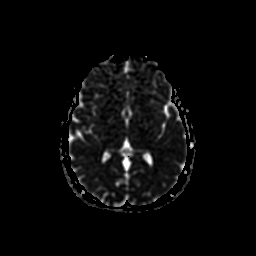
[im 35/47]
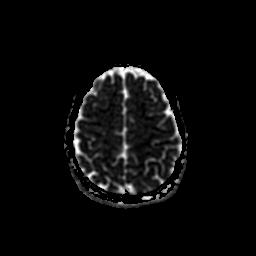
[im 47/47]
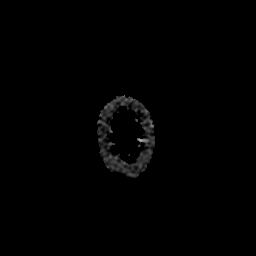

[Series 500: DWI · coronal · 3.0mm · 1.09mm/px · 6 of 54 slices shown (4 of 4)]
[im 1/54]
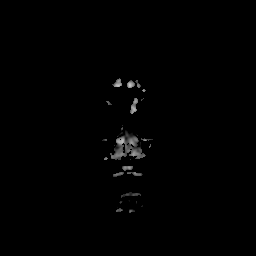
[im 11/54]
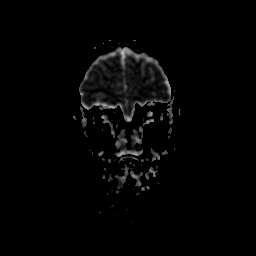
[im 22/54]
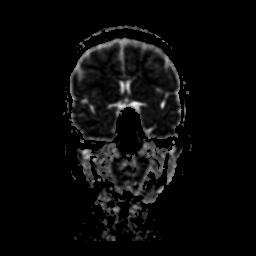
[im 32/54]
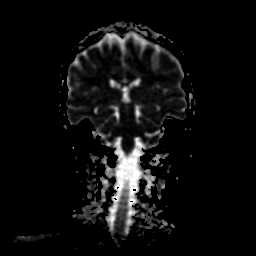
[im 43/54]
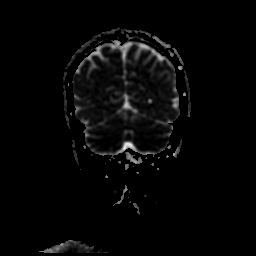
[im 54/54]
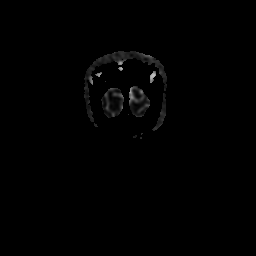

[40 of 48 positions shown; findings below may reference images not displayed]

FINDINGS: Brain: No acute infarction, hemorrhage, hydrocephalus, extra-axial
collection or mass lesion. No structural or signal abnormality of
the brain.

Vascular: Normal flow voids.

Skull and upper cervical spine: Normal marrow signal.

Sinuses/Orbits: Mild left frontal sinus mucosal thickening. Small
right maxillary sinus mucous retention cyst. No abnormal signal of
the mastoid air cells. Orbits are unremarkable.

Other: None.
IMPRESSION: Normal MRI of the brain.
